# Patient Record
Sex: Male | Born: 1980 | Race: White | Hispanic: No | Marital: Married | Smoking: Never smoker
Health system: Southern US, Community
[De-identification: ages and names within clinical notes are randomized; demographics above are authoritative.]

## PROBLEM LIST (undated history)

## (undated) DIAGNOSIS — I341 Nonrheumatic mitral (valve) prolapse: Secondary | ICD-10-CM

## (undated) DIAGNOSIS — F32A Depression, unspecified: Secondary | ICD-10-CM

## (undated) DIAGNOSIS — F419 Anxiety disorder, unspecified: Secondary | ICD-10-CM

## (undated) DIAGNOSIS — T7840XA Allergy, unspecified, initial encounter: Secondary | ICD-10-CM

## (undated) HISTORY — DX: Anxiety disorder, unspecified: F41.9

## (undated) HISTORY — DX: Allergy, unspecified, initial encounter: T78.40XA

## (undated) HISTORY — DX: Depression, unspecified: F32.A

## (undated) HISTORY — PX: APPENDECTOMY: SHX54

---

## 2006-08-19 ENCOUNTER — Other Ambulatory Visit: Payer: Self-pay

## 2006-08-19 ENCOUNTER — Emergency Department: Payer: Self-pay | Admitting: Emergency Medicine

## 2006-08-24 ENCOUNTER — Ambulatory Visit: Payer: Self-pay | Admitting: Emergency Medicine

## 2007-01-10 ENCOUNTER — Other Ambulatory Visit: Payer: Self-pay

## 2007-01-10 ENCOUNTER — Emergency Department: Payer: Self-pay | Admitting: Emergency Medicine

## 2009-03-17 ENCOUNTER — Emergency Department: Payer: Self-pay | Admitting: Emergency Medicine

## 2010-02-21 ENCOUNTER — Observation Stay: Payer: Self-pay | Admitting: Surgery

## 2010-02-25 LAB — PATHOLOGY REPORT

## 2010-11-27 ENCOUNTER — Emergency Department: Payer: Self-pay | Admitting: Emergency Medicine

## 2010-12-09 ENCOUNTER — Emergency Department: Payer: Self-pay | Admitting: Emergency Medicine

## 2010-12-11 ENCOUNTER — Emergency Department: Payer: Self-pay | Admitting: *Deleted

## 2011-02-14 ENCOUNTER — Emergency Department: Payer: Self-pay | Admitting: *Deleted

## 2012-03-10 ENCOUNTER — Emergency Department: Payer: Self-pay | Admitting: Emergency Medicine

## 2012-03-10 LAB — COMPREHENSIVE METABOLIC PANEL
Albumin: 4.7 g/dL (ref 3.4–5.0)
Alkaline Phosphatase: 77 U/L (ref 50–136)
Anion Gap: 6 — ABNORMAL LOW (ref 7–16)
Bilirubin,Total: 0.6 mg/dL (ref 0.2–1.0)
Calcium, Total: 9.1 mg/dL (ref 8.5–10.1)
Chloride: 107 mmol/L (ref 98–107)
Co2: 25 mmol/L (ref 21–32)
EGFR (Non-African Amer.): >60
Osmolality: 275 (ref 275–301)
SGOT(AST): 40 U/L — ABNORMAL HIGH (ref 15–37)
SGPT (ALT): 34 U/L (ref 12–78)
Sodium: 138 mmol/L (ref 136–145)

## 2012-03-10 LAB — CBC
HCT: 47.4 % (ref 40.0–52.0)
MCH: 30.1 pg (ref 26.0–34.0)
MCHC: 34.4 g/dL (ref 32.0–36.0)
Platelet: 235 10*3/uL (ref 150–440)
RBC: 5.42 10*6/uL (ref 4.40–5.90)
RDW: 13.4 % (ref 11.5–14.5)

## 2012-03-10 LAB — LIPASE, BLOOD: Lipase: 106 U/L (ref 73–393)

## 2012-03-24 DIAGNOSIS — K219 Gastro-esophageal reflux disease without esophagitis: Secondary | ICD-10-CM | POA: Insufficient documentation

## 2012-03-24 DIAGNOSIS — E663 Overweight: Secondary | ICD-10-CM | POA: Insufficient documentation

## 2012-03-24 DIAGNOSIS — E669 Obesity, unspecified: Secondary | ICD-10-CM | POA: Insufficient documentation

## 2012-05-10 DIAGNOSIS — K5900 Constipation, unspecified: Secondary | ICD-10-CM | POA: Insufficient documentation

## 2012-07-29 ENCOUNTER — Ambulatory Visit: Payer: Self-pay | Admitting: Internal Medicine

## 2015-04-25 ENCOUNTER — Encounter: Payer: Self-pay | Admitting: Emergency Medicine

## 2015-04-25 ENCOUNTER — Emergency Department
Admission: EM | Admit: 2015-04-25 | Discharge: 2015-04-25 | Disposition: A | Discharge status: Home / Self Care | Payer: BLUE CROSS/BLUE SHIELD | Attending: Emergency Medicine | Admitting: Emergency Medicine

## 2015-04-25 ENCOUNTER — Emergency Department: Payer: BLUE CROSS/BLUE SHIELD

## 2015-04-25 DIAGNOSIS — F419 Anxiety disorder, unspecified: Secondary | ICD-10-CM | POA: Diagnosis not present

## 2015-04-25 DIAGNOSIS — F17200 Nicotine dependence, unspecified, uncomplicated: Secondary | ICD-10-CM | POA: Insufficient documentation

## 2015-04-25 DIAGNOSIS — R079 Chest pain, unspecified: Secondary | ICD-10-CM | POA: Insufficient documentation

## 2015-04-25 HISTORY — DX: Nonrheumatic mitral (valve) prolapse: I34.1

## 2015-04-25 LAB — URINALYSIS COMPLETE WITH MICROSCOPIC (ARMC ONLY)
BACTERIA UA: NONE SEEN
Bilirubin Urine: NEGATIVE
GLUCOSE, UA: NEGATIVE mg/dL
Hgb urine dipstick: NEGATIVE
Ketones, ur: NEGATIVE mg/dL
Leukocytes, UA: NEGATIVE
Nitrite: NEGATIVE
Protein, ur: NEGATIVE mg/dL
RBC / HPF: NONE SEEN RBC/hpf (ref 0–5)
Specific Gravity, Urine: 1.003 — ABNORMAL LOW (ref 1.005–1.030)
Squamous Epithelial / LPF: NONE SEEN
pH: 7 (ref 5.0–8.0)

## 2015-04-25 LAB — BASIC METABOLIC PANEL
Anion gap: 6 (ref 5–15)
BUN: 11 mg/dL (ref 6–20)
CO2: 26 mmol/L (ref 22–32)
Calcium: 9.5 mg/dL (ref 8.9–10.3)
Chloride: 104 mmol/L (ref 101–111)
Creatinine, Ser: 1.08 mg/dL (ref 0.61–1.24)
GFR calc Af Amer: >60 mL/min (ref >60)
GLUCOSE: 107 mg/dL — AB (ref 65–99)
POTASSIUM: 3.7 mmol/L (ref 3.5–5.1)
Sodium: 136 mmol/L (ref 135–145)

## 2015-04-25 LAB — CBC
HEMATOCRIT: 45.6 % (ref 40.0–52.0)
HEMOGLOBIN: 15.7 g/dL (ref 13.0–18.0)
MCH: 29.8 pg (ref 26.0–34.0)
MCHC: 34.5 g/dL (ref 32.0–36.0)
MCV: 86.2 fL (ref 80.0–100.0)
Platelets: 236 10*3/uL (ref 150–440)
RBC: 5.29 MIL/uL (ref 4.40–5.90)
RDW: 13.6 % (ref 11.5–14.5)
WBC: 14.7 10*3/uL — ABNORMAL HIGH (ref 3.8–10.6)

## 2015-04-25 LAB — GLUCOSE, CAPILLARY: Glucose-Capillary: 91 mg/dL (ref 65–99)

## 2015-04-25 LAB — TSH: TSH: 1.509 u[IU]/mL (ref 0.350–4.500)

## 2015-04-25 LAB — TROPONIN I: Troponin I: <0.03 ng/mL (ref <0.031)

## 2015-04-25 LAB — OSMOLALITY: Osmolality: 285 mOsm/kg (ref 275–295)

## 2015-04-25 MED ORDER — ALPRAZOLAM 0.25 MG PO TABS: 0.2500 mg | ORAL_TABLET | Freq: Two times a day (BID) | ORAL | Status: DC

## 2015-04-25 NOTE — ED Provider Notes (Signed)
[facility] Emergency Department Provider Note   ____________________________________________  Time seen:  I have reviewed the triage vital signs and the triage nursing note.  HISTORY  Chief Complaint Chest Pain   Historian Patient  HPI Ian Harper is a 35 y.o. male with a history of mitral valve prolapse, who is here for complaint of feeling of anxiety, occasional palpitations, and chest pain that he notices centrally usually when he wakes up at night. He is extremely anxious about his health. He started having increased thirst and urination several weeks ago, and became extremely worried that he might have diabetes. He is continued to be extremely anxious about this despite having a reassuring [facility]. He has started exercising excessively, and is actually having extreme aversion to food despite being hungry, he is worried that it will lead to diabetes.  Symptoms are moderate. He is currently not having chest pain. He does still feeling anxious.    Past Medical History  Diagnosis Date  . Mitral valve prolapse     There are no active problems to display for this patient.   Past Surgical History  Procedure Laterality Date  . Appendectomy      Current Outpatient Rx  Name  Route  Sig  Dispense  Refill  . ALPRAZolam (XANAX) 0.25 MG tablet   Oral   Take 1 tablet (0.25 mg total) by mouth 2 (two) times daily.   8 tablet   0     Allergies Review of patient's allergies indicates no known allergies.  No family history on file.  Social History Social History  Substance Use Topics  . Smoking status: Current Every Day Smoker  . Smokeless tobacco: None  . Alcohol Use: No    Review of Systems  Constitutional: Negative for fever. Eyes: Negative for visual changes. ENT: Negative for sore throat. Cardiovascular: As per history of present illness Respiratory: No cough. Gastrointestinal: Negative for  abdominal pain, vomiting and diarrhea. Genitourinary: Negative for dysuria. Musculoskeletal: Negative for back pain. Skin: Negative for rash. Neurological: Negative for headache. 10 point Review of Systems otherwise negative ____________________________________________   PHYSICAL EXAM:  VITAL SIGNS: ED Triage Vitals  Enc Vitals Group     BP 04/25/15 0826 129/80 mmHg     Pulse Rate 04/25/15 0826 80     Resp 04/25/15 0826 18     Temp 04/25/15 0826 98 F (36.7 C)     Temp Source 04/25/15 0826 Oral     SpO2 04/25/15 0826 99 %     Weight 04/25/15 0826 221 lb (100.245 kg)     Height 04/25/15 0826 5\' 9"  (1.753 m)     Head Cir --      Peak Flow --      Pain Score 04/25/15 0835 0     Pain Loc --      Pain Edu? --      Excl. in GC? --      Constitutional: Alert and oriented. Well appearing and in no distress. Pretty anxious. HEENT   Head: Normocephalic and atraumatic.      Eyes: Conjunctivae are normal. PERRL. Normal extraocular movements.      Ears:         Nose: No congestion/rhinnorhea.   Mouth/Throat: Mucous membranes are moist.   Neck: No stridor. Cardiovascular/Chest: Normal rate, regular rhythm.  No murmurs, rubs, or gallops. Respiratory: Normal respiratory effort without tachypnea nor retractions. Breath sounds are clear and equal bilaterally. No wheezes/rales/rhonchi. Gastrointestinal:  Soft. No distention, no guarding, no rebound. Nontender.    Genitourinary/rectal:Deferred Musculoskeletal: Nontender with normal range of motion in all extremities. No joint effusions.  No lower extremity tenderness.  No edema. Neurologic:  Normal speech and language. No gross or focal neurologic deficits are appreciated. Skin:  Skin is warm, dry and intact. No rash noted. Psychiatric: Anxious. No depression. Affect is normal. Speech and behavior are normal. Patient exhibits appropriate insight and judgment.  ____________________________________________   EKG I, Governor Rooks,  MD, the attending physician have personally viewed and interpreted all ECGs.  85 bpm. Normal sinus rhythm. Narrow QRS. Normal axis. Normal ST and T-wave ____________________________________________  LABS (pertinent positives/negatives)  Basic minimal panel within normal limits White blood count 14.7, hemoglobin 15.7, platelet count 236 Urinalysis negative TSH 1.509 Osmolality 285 Troponin less than 0.03 ____________________________________________  RADIOLOGY All Xrays were viewed by me. Imaging interpreted by Radiologist.  Chest two-view:  IMPRESSION: Old granulomatous disease and mild bronchitic changes without acute infiltrate. __________________________________________  PROCEDURES  Procedure(s) performed: None  Critical Care performed: None  ____________________________________________   ED COURSE / ASSESSMENT AND PLAN  Pertinent labs & imaging results that were available during my care of the patient were reviewed by me and considered in my medical decision making (see chart for details).   Although the initial chief complaint was chest pain, his symptoms didn't not sound concerning for emergency cause of chest pain or shortness of breath. His exam and evaluation are reassuring. I'm not suspicious for ACS, vascular emergency, or pulmonary emergency including PE.  Patient is very anxious. He is recently become fixated on concern about being diabetic or prediabetic. I discussed with him is reassuring recent hematoma A1c, and his evaluation today.  We discussed at length getting the anxiety under control so that he can move forward. He is no indication for need for emergency psychiatric evaluation. I'm asking him to follow-up with his primary care physician, and I will also refer him with information for [facility].     CONSULTATIONS:   None   Patient / Family / Caregiver informed of clinical course, medical decision-making process, and agree with plan.   I discussed  return precautions, follow-up instructions, and discharged instructions with patient and/or family.   ___________________________________________   FINAL CLINICAL IMPRESSION(S) / ED DIAGNOSES   Final diagnoses:  Anxiety  Nonspecific chest pain              Note: This dictation was prepared with Dragon dictation. Any transcriptional errors that result from this process are unintentional   Governor Rooks, MD 04/25/15 1348

## 2015-04-25 NOTE — ED Notes (Addendum)
Pt here with c/o chest pain x4 day, wakes him up at night. Pt reports centralized CP. Pt reports he's been thirsty lately, had his CBG checked last week and it was 112; reports he's been working out more lately, has been urinating. Also reports loss of appetite recently.

## 2015-04-25 NOTE — ED Notes (Signed)
Pt roomed, vitals taken, EKG performed and exported by tech, Elita QuickPam

## 2015-04-25 NOTE — Discharge Instructions (Signed)
You were evaluated for multiple symptoms, and your exam and evaluation are reassuring in the emergency department today. We discussed, that it seems like anxiety is creating a significant amount of stress and possibly even symptoms. Continue to work on Magazine features editor or flight reflex.  You may find it helpful to look into programs on "amygdala" retraining such as Dr. Chales Abrahams online, or "Dynamic Neural Retraining System" by Blanchie Dessert online, or "Tapping" technique - thetappingsolution.com or even try You Tube for examples.  Return to the emergency department if you have any new or worsening condition including chest pain, trouble breathing, fever, abdominal pain, black or bloody stool, or any other symptoms concerning to you.   Generalized Anxiety Disorder Generalized anxiety disorder (GAD) is a mental disorder. It interferes with life functions, including relationships, work, and school. GAD is different from normal anxiety, which everyone experiences at some point in their lives in response to specific life events and activities. Normal anxiety actually helps [place] prepare for and get through these life events and activities. Normal anxiety goes away after the event or activity is over.  GAD causes anxiety that is not necessarily related to specific events or activities. It also causes excess anxiety in proportion to specific events or activities. The anxiety associated with GAD is also difficult to control. GAD can vary from mild to severe. People with severe GAD can have intense waves of anxiety with physical symptoms (panic attacks).  SYMPTOMS The anxiety and worry associated with GAD are difficult to control. This anxiety and worry are related to many life events and activities and also occur more days than not for 6 months or longer. People with GAD also have three or more of the following symptoms (one or more in children):  Restlessness.   Fatigue.  Difficulty concentrating.    Irritability.  Muscle tension.  Difficulty sleeping or unsatisfying sleep. DIAGNOSIS GAD is diagnosed through an assessment by your health care provider. Your health care provider will ask you questions aboutyour mood,physical symptoms, and events in your life. Your health care provider may ask you about your medical history and use of alcohol or drugs, including prescription medicines. Your health care provider may also do a physical exam and blood tests. Certain medical conditions and the use of certain substances can cause symptoms similar to those associated with GAD. Your health care provider may refer you to a mental health specialist for further evaluation. TREATMENT The following therapies are usually used to treat GAD:   Medication. Antidepressant medication usually is prescribed for long-term daily control. Antianxiety medicines may be added in severe cases, especially when panic attacks occur.   Talk therapy (psychotherapy). Certain types of talk therapy can be helpful in treating GAD by providing support, education, and guidance. A form of talk therapy called cognitive behavioral therapy can teach you healthy ways to think about and react to daily life events and activities.  Stress managementtechniques. These include yoga, meditation, and exercise and can be very helpful when they are practiced regularly. A mental health specialist can help determine which treatment is best for you. Some people see improvement with one therapy. However, other people require a combination of therapies.   This information is not intended to replace advice given to you by your health care provider. Make sure you discuss any questions you have with your health care provider.   Document Released: 05/30/2012 Document Revised: 02/23/2014 Document Reviewed: 05/30/2012 Elsevier Interactive Patient Education 2016 Elsevier Inc.   Panic Attacks Panic  attacks are sudden, short-livedsurges of severe  anxiety, fear, or discomfort. They may occur for no reason when you are relaxed, when you are anxious, or when you are sleeping. Panic attacks may occur for a number of reasons:   Healthy people occasionally have panic attacks in extreme, life-threatening situations, such as war or natural disasters. Normal anxiety is a protective mechanism of the body that helps us react to danger (fight or flight response).  Panic attacks are often seen with anxiety disorders, such as panic disorder, social anxiety disorder, generalized anxiety disorder, and phobias. Anxiety disorders cause excessive or uncontrollable anxiety. They may interfere with your relationships or other life activities.  Panic attacks are sometimes seen with other mental illnesses, such as depression and posttraumatic stress disorder.  Certain medical conditions, prescription medicines, and drugs of abuse can cause panic attacks. SYMPTOMS  Panic attacks start suddenly, peak within 20 minutes, and are accompanied by four or more of the following symptoms:  Pounding heart or fast heart rate (palpitations).  Sweating.  Trembling or shaking.  Shortness of breath or feeling smothered.  Feeling choked.  Chest pain or discomfort.  Nausea or strange feeling in your stomach.  Dizziness, light-headedness, or feeling like you will faint.  Chills or hot flushes.  Numbness or tingling in your lips or hands and feet.  Feeling that things are not real or feeling that you are not yourself.  Fear of losing control or going crazy.  Fear of dying. Some of these symptoms can mimic serious medical conditions. For example, you may think you are having a heart attack. Although panic attacks can be very scary, they are not life threatening. DIAGNOSIS  Panic attacks are diagnosed through an assessment by your health care provider. Your health care provider will ask questions about your symptoms, such as where and when they occurred. Your  health care provider will also ask about your medical history and use of alcohol and drugs, including prescription medicines. Your health care provider may order blood tests or other studies to rule out a serious medical condition. Your health care provider may refer you to a mental health professional for further evaluation. TREATMENT   Most healthy people who have one or two panic attacks in an extreme, life-threatening situation will not require treatment.  The treatment for panic attacks associated with anxiety disorders or other mental illness typically involves counseling with a mental health professional, medicine, or a combination of both. Your health care provider will help determine what treatment is best for you.  Panic attacks due to physical illness usually go away with treatment of the illness. If prescription medicine is causing panic attacks, talk with your health care provider about stopping the medicine, decreasing the dose, or substituting another medicine.  Panic attacks due to alcohol or drug abuse go away with abstinence. Some adults need professional help in order to stop drinking or using drugs. HOME CARE INSTRUCTIONS   Take all medicines as directed by your health care provider.   Schedule and attend follow-up visits as directed by your health care provider. It is important to keep all your appointments. SEEK MEDICAL CARE IF:  You are not able to take your medicines as prescribed.  Your symptoms do not improve or get worse. SEEK IMMEDIATE MEDICAL CARE IF:   You experience panic attack symptoms that are different than your usual symptoms.  You have serious thoughts about hurting yourself or others.  You are taking medicine for panic attacks and have a  serious side effect. MAKE SURE YOU:  Understand these instructions.  Will watch your condition.  Will get help right away if you are not doing well or get worse.   This information is not intended to replace  advice given to you by your health care provider. Make sure you discuss any questions you have with your health care provider.   Document Released: 02/02/2005 Document Revised: 02/07/2013 Document Reviewed: 09/16/2012 Elsevier Interactive Patient Education [employer]

## 2015-05-15 ENCOUNTER — Emergency Department
Admission: EM | Admit: 2015-05-15 | Discharge: 2015-05-15 | Disposition: A | Discharge status: Home / Self Care | Payer: BLUE CROSS/BLUE SHIELD | Attending: Emergency Medicine | Admitting: Emergency Medicine

## 2015-05-15 DIAGNOSIS — F419 Anxiety disorder, unspecified: Secondary | ICD-10-CM

## 2015-05-15 DIAGNOSIS — R350 Frequency of micturition: Secondary | ICD-10-CM | POA: Diagnosis present

## 2015-05-15 DIAGNOSIS — I341 Nonrheumatic mitral (valve) prolapse: Secondary | ICD-10-CM | POA: Insufficient documentation

## 2015-05-15 DIAGNOSIS — Z79899 Other long term (current) drug therapy: Secondary | ICD-10-CM | POA: Insufficient documentation

## 2015-05-15 DIAGNOSIS — F17200 Nicotine dependence, unspecified, uncomplicated: Secondary | ICD-10-CM | POA: Insufficient documentation

## 2015-05-15 NOTE — Discharge Instructions (Signed)
Please seek medical attention for any high fevers, chest pain, shortness of breath, change in behavior, persistent vomiting, bloody stool or any other new or concerning symptoms.  

## 2015-05-15 NOTE — ED Notes (Signed)
Pt states urinary frequency and painful urination x 2-3 weeks. Pt states pain in R groin and lower R back. Pt denies hx of UTI or kidney stones. Pt states hx of odorous urine. Pt states painful ejaculation during sexual intercourse recently. Pt states urination q1h during morning, urine darker in evening. Pt states measuring urine cloudy and possible sediment in urine.

## 2015-05-15 NOTE — ED Provider Notes (Signed)
[facility] Emergency Department Provider Note    ____________________________________________  Time seen: ~1930  I have reviewed the triage vital signs and the nursing notes.   HISTORY  Chief Complaint Urinary Frequency   History limited by: Not Limited   HPI Ian Harper is a 35 y.o. male who presents to the emergency department today because of continued concerns for urinary problems. The patient states that he has been having some pain and discoloration of urine. This is been going on for roughly 1 month. He was seen in the emergency department earlier this month and had normal urine and blood work. He then saw a walk-in clinic 4 days ago and was again told that he had a normal urine and blood work. The symptoms have not changed since he was seen in the urgent care center 4 days ago. He denies any fevers.    Past Medical History  Diagnosis Date  . Mitral valve prolapse     There are no active problems to display for this patient.   Past Surgical History  Procedure Laterality Date  . Appendectomy      Current Outpatient Rx  Name  Route  Sig  Dispense  Refill  . ALPRAZolam (XANAX) 0.25 MG tablet   Oral   Take 1 tablet (0.25 mg total) by mouth 2 (two) times daily.   8 tablet   0     Allergies Review of patient's allergies indicates no known allergies.  History reviewed. No pertinent family history.  Social History Social History  Substance Use Topics  . Smoking status: Current Every Day Smoker  . Smokeless tobacco: None  . Alcohol Use: No    Review of Systems  Constitutional: Negative for fever. Cardiovascular: Negative for chest pain. Respiratory: Negative for shortness of breath. Gastrointestinal: Negative for abdominal pain, vomiting and diarrhea. Neurological: Negative for headaches, focal weakness or numbness.  10-point ROS otherwise negative.  ____________________________________________   PHYSICAL EXAM:  VITAL  SIGNS: ED Triage Vitals  Enc Vitals Group     BP 05/15/15 1921 130/85 mmHg     Pulse Rate 05/15/15 1921 78     Resp 05/15/15 1921 18     Temp 05/15/15 1921 98.6 F (37 C)     Temp Source 05/15/15 1921 Oral     SpO2 05/15/15 1921 99 %     Weight 05/15/15 1921 215 lb (97.523 kg)     Height 05/15/15 1921  (1.753 m)     Head Cir --      Peak Flow --      Pain Score 05/15/15 1925 0   Constitutional: Alert and oriented. Appears anxious. Eyes: Conjunctivae are normal. PERRL. Normal extraocular movements. ENT   Head: Normocephalic and atraumatic.   Nose: No congestion/rhinnorhea.   Mouth/Throat: Mucous membranes are moist.   Neck: No stridor. Hematological/Lymphatic/Immunilogical: No cervical lymphadenopathy. Cardiovascular: Normal rate, regular rhythm.  No murmurs, rubs, or gallops. Respiratory: Normal respiratory effort without tachypnea nor retractions. Breath sounds are clear and equal bilaterally. No wheezes/rales/rhonchi. Gastrointestinal: Soft and nontender. No distention.  Genitourinary: Deferred Musculoskeletal: Normal range of motion in all extremities. No joint effusions.   Neurologic:  Normal speech and language. No gross focal neurologic deficits are appreciated.  Skin:  Skin is warm, dry and intact. No rash noted. Psychiatric: Mood and affect are normal. Speech and behavior are normal. Patient exhibits appropriate insight and judgment.  ____________________________________________    LABS (pertinent positives/negatives)  None  ____________________________________________   EKG  None  ____________________________________________    RADIOLOGY  None  ____________________________________________   PROCEDURES  Procedure(s) performed: None  Critical Care performed: No  ____________________________________________   INITIAL IMPRESSION / ASSESSMENT AND PLAN / ED COURSE  Pertinent labs & imaging results that were available during my  care of the patient were reviewed by me and considered in my medical decision making (see chart for details).  Patient presented to the emergency department today with continued complaints of urinary symptoms. Patient has been evaluated for this very recently. 4 days ago he had a negative urine and blood work. Symptoms have not changed since that time. Patient is quite anxious. I did have a discussion with patient and he mentioned his concern that he might have cancer. He asked if the blood work showed cancer. I discussed with the patient that the blood work that was sent both the initial time using the emergency department and when he saw primary care did not specifically test for cancer. I asked why he was concerned for cancer and it seemed that he was simply anxious about it. I tried to discuss with the patient the unlikely scenario that he might have prostate cancer. I told him that given his age and symptoms I highly doubt prostate cancer would be the etiology. At this point I do not feel that repeating blood work or urine would be very productive. Given the symptoms have not changed I would not expect a change in the results. I did encourage patient to keep appointment with his primary care that he is now scheduled.  ____________________________________________   FINAL CLINICAL IMPRESSION(S) / ED DIAGNOSES  Final diagnoses:  Anxiety     Phineas SemenGraydon Arie Gable, MD 05/15/15 1950

## 2015-06-13 DIAGNOSIS — Z7189 Other specified counseling: Secondary | ICD-10-CM | POA: Insufficient documentation

## 2015-06-13 DIAGNOSIS — Z7185 Encounter for immunization safety counseling: Secondary | ICD-10-CM | POA: Insufficient documentation

## 2015-07-04 ENCOUNTER — Other Ambulatory Visit: Payer: Self-pay | Admitting: Gastroenterology

## 2015-07-04 DIAGNOSIS — K6289 Other specified diseases of anus and rectum: Secondary | ICD-10-CM

## 2015-07-04 DIAGNOSIS — Z87898 Personal history of other specified conditions: Secondary | ICD-10-CM

## 2015-07-04 DIAGNOSIS — N419 Inflammatory disease of prostate, unspecified: Secondary | ICD-10-CM

## 2015-07-04 DIAGNOSIS — R1031 Right lower quadrant pain: Secondary | ICD-10-CM

## 2015-07-04 DIAGNOSIS — R591 Generalized enlarged lymph nodes: Secondary | ICD-10-CM

## 2015-07-04 DIAGNOSIS — R634 Abnormal weight loss: Secondary | ICD-10-CM

## 2015-07-05 ENCOUNTER — Encounter: Payer: Self-pay | Admitting: Internal Medicine

## 2015-07-10 ENCOUNTER — Ambulatory Visit: Admission: RE | Admit: 2015-07-10 | Payer: BLUE CROSS/BLUE SHIELD | Source: Ambulatory Visit

## 2015-07-17 ENCOUNTER — Ambulatory Visit: Payer: BLUE CROSS/BLUE SHIELD

## 2015-07-19 ENCOUNTER — Encounter: Payer: Self-pay | Admitting: Internal Medicine

## 2015-07-24 ENCOUNTER — Ambulatory Visit: Admission: RE | Admit: 2015-07-24 | Payer: BLUE CROSS/BLUE SHIELD | Source: Ambulatory Visit

## 2015-07-26 ENCOUNTER — Telehealth: Payer: Self-pay | Admitting: *Deleted

## 2015-07-26 DIAGNOSIS — R9389 Abnormal findings on diagnostic imaging of other specified body structures: Secondary | ICD-10-CM

## 2015-07-26 NOTE — Telephone Encounter (Signed)
-----   Message from Shane CrutchPradeep Ramachandran, MD sent at 07/25/2015  8:49 AM EDT ----- Regarding: needs cxr Pt has appt on 6/12 for chest pain. I don't see a CXR, please have 2-view CXR  performed before the appointment or make sure the pt has one on CD performed within the past month.

## 2015-07-26 NOTE — Telephone Encounter (Signed)
LMOM for pt that [facility] and to give me a call back.

## 2015-07-29 ENCOUNTER — Encounter: Payer: BLUE CROSS/BLUE SHIELD | Admitting: Internal Medicine

## 2015-07-29 NOTE — Telephone Encounter (Signed)
LMOM for pt to make sure he received my voicemail and to call back with any questions.

## 2015-08-05 ENCOUNTER — Other Ambulatory Visit
Admission: RE | Admit: 2015-08-05 | Discharge: 2015-08-05 | Disposition: A | Discharge status: Home / Self Care | Payer: BLUE CROSS/BLUE SHIELD | Source: Ambulatory Visit | Attending: Internal Medicine | Admitting: Internal Medicine

## 2015-08-05 ENCOUNTER — Ambulatory Visit
Admission: RE | Admit: 2015-08-05 | Discharge: 2015-08-05 | Disposition: A | Discharge status: Home / Self Care | Payer: BLUE CROSS/BLUE SHIELD | Source: Ambulatory Visit | Attending: Internal Medicine | Admitting: Internal Medicine

## 2015-08-05 ENCOUNTER — Encounter: Payer: Self-pay | Admitting: Internal Medicine

## 2015-08-05 ENCOUNTER — Ambulatory Visit (INDEPENDENT_AMBULATORY_CARE_PROVIDER_SITE_OTHER): Payer: BLUE CROSS/BLUE SHIELD | Admitting: Internal Medicine

## 2015-08-05 VITALS — BP 124/70 | HR 82 | Ht 69.0 in | Wt 207.0 lb

## 2015-08-05 DIAGNOSIS — R911 Solitary pulmonary nodule: Secondary | ICD-10-CM | POA: Diagnosis not present

## 2015-08-05 DIAGNOSIS — B394 Histoplasmosis capsulati, unspecified: Secondary | ICD-10-CM

## 2015-08-05 DIAGNOSIS — R938 Abnormal findings on diagnostic imaging of other specified body structures: Secondary | ICD-10-CM | POA: Insufficient documentation

## 2015-08-05 DIAGNOSIS — R591 Generalized enlarged lymph nodes: Secondary | ICD-10-CM | POA: Diagnosis not present

## 2015-08-05 DIAGNOSIS — R9389 Abnormal findings on diagnostic imaging of other specified body structures: Secondary | ICD-10-CM

## 2015-08-05 NOTE — Patient Instructions (Addendum)
--  Will check urine histoplasma antigen.  --Will check serum ACE level. --Keep pets out of bedroom.   --If you continue to have chest colds start on an antihistamine such as claritin, if continues, then call us back to be evaluated.   --CXR 2 view in 1 year and follow up at that time.

## 2015-08-05 NOTE — Progress Notes (Signed)
[name]      Assessment and Plan:  Hilar lymphadenopathy. -This is likely related to granulomatous disease, which may relate to previous infection or other conditions such as sarcoidosis. The patient used to live in the [place], and thus is at high risk for exposure to histoplasmosis. -We will check an Ace level, urine histo antigen.  -The patient's lymphadenopathy appears to have calcified, pointing to a likely benign cause. We'll check The above testing to look for active infection or sarcoidosis.  Lung nodule. -Review of previous chest x-rays and CT films from 2002 and 2003 show evidence of nodule in the right middle lobe that time, this appears to have progressed since that time. -Given 15 year history of these nodules, this likely represents a benign process, we'll continue to follow with yearly chest x-rays.  Allergic rhino-sinusitis, possible asthma. -He notes that he has occasional episodes of some chest tightness, he currently lives with a cat that sleeps in the bed. -Recommend that he remove the pets from the bedroom, also recommended that he could start taking Claritin if he starts to have these episodes more often. I also asked that he call us should he start to develop respiratory problems for further evaluation and consideration of starting therapy for asthma. However, at this time, these episodes are very infrequent and mild. Therefore, would not initiate therapy for asthma at this time unless the patient started develop further symptoms.   Date: 08/05/2015  MRN# [id number] Ian Harper [birth year]  Referring Physician:   Alven Harper is a 35 y.o. old male seen in consultation for chief complaint of:    Chief Complaint  Patient presents with  . pulmonary consult    pt ref by city of [place]. had CXR today. pt c/o occ hot flashes. denies sob, cough, wheezing or cp/tightness     HPI:   Back in 2002 he was diagnosed with MVP, he had  a CXR which showed lung nodule and was told to keep an eye on it, but he did not for the past 15 years.  He notes that last winter he had a few episodes of laryngitis but not trouble breathing. Earlier this year he had issues with some sneezing and wheezing which felt like allergies, this has not repeat since that time.   He is a nonsmoker.  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 He used to live [place]. He has never been a smoker. He is active and has no breathing issues, he notes that he occasionally has itchiness in his lung.  He has a cat in the bedroom and sleeps in the bed.  He has no stuffy nose. He takes no antihistamine.   Review of chest x-ray film from 04/25/15: No other images are available for viewing. There is left calcified hilar lymphadenopathy, also seen on the right side, as well as a possible nodule in the right mid zone. -PPD test 07/05/15 results reviewed, there is 0 mm of induration. -Lab testing including CBC, TSH drawn on 07/05/15, normal -Review of previous CT films were visualized: Scans from 06/11/2000, at that time, there appeared to be a 1.5-2 cm right middle lobe nodule did not notice any calcified lymph nodes in procedures at that time, there are no calcified lymph nodes within the liver, no calcified lymphadenopathy or nodules seen in addition, I see minimal mediastinal or hilar lymphadenopathy. Chest x-ray 2 view on 04/15/00. Hilar fullness, right middle lobe  nodule seen Chest x-ray 2 view 06/13/01, mild progression from changes seen previously, early calcification seen in the left hilar area.  PMHX:   Past Medical History  Diagnosis Date  . Mitral valve prolapse    Surgical Hx:  Past Surgical History  Procedure Laterality Date  . Appendectomy     Family Hx:  No family history on file. Social Hx:   Social History  Substance Use Topics  . Smoking status: Never Smoker   . Smokeless tobacco: None  . Alcohol  Use: No   Medication:   Current Outpatient Rx  Name  Route  Sig  Dispense  Refill  . ALPRAZolam (XANAX) 0.25 MG tablet   Oral   Take 1 tablet (0.25 mg total) by mouth 2 (two) times daily.   8 tablet   0       Allergies:  Review of patient's allergies indicates no known allergies.  Review of Systems: Gen:  Denies  fever, sweats, chills HEENT: Denies blurred vision, double vision. bleeds, sore throat Cvc:  No dizziness, chest pain. Resp:   Denies cough or sputum production, shortness of breath Gi: Denies swallowing difficulty, stomach pain. Gu:  Denies bladder incontinence, burning urine Ext:   No Joint pain, stiffness. Skin: No skin rash,  hives  Endoc:  No polyuria, polydipsia. Psych: No depression, insomnia. Other:  All other systems were reviewed with the patient and were negative other that what is mentioned in the HPI.   Physical Examination:   VS: BP 124/70 mmHg  Pulse 82  Ht 5\' 9"  (1.753 m)  Wt 207 lb (93.895 kg)  BMI 30.55 kg/m2  SpO2 99%  General Appearance: No distress  Neuro:without focal findings,  speech normal,  HEENT: PERRLA, EOM intact.   Pulmonary: normal breath sounds, No wheezing.  CardiovascularNormal S1,S2.  No m/r/g.   Abdomen: Benign, Soft, non-tender. Renal:  No costovertebral tenderness  GU:  No performed at this time. Endoc: No evident thyromegaly, no signs of acromegaly. Skin:   warm, no rashes, no ecchymosis  Extremities: normal, no cyanosis, clubbing.  Other findings:    LABORATORY PANEL:   CBC No results for input(s): WBC, HGB, HCT, PLT in the last 168 hours. ------------------------------------------------------------------------------------------------------------------  Chemistries  No results for input(s): NA, K, CL, CO2, GLUCOSE, BUN, CREATININE, CALCIUM, MG, AST, ALT, ALKPHOS, BILITOT in the last 168 hours.  Invalid input(s):  GFRCGP ------------------------------------------------------------------------------------------------------------------  Cardiac Enzymes No results for input(s): TROPONINI in the last 168 hours. ------------------------------------------------------------  RADIOLOGY:  No results found.     Thank  you for the consultation and for allowing [facility] to assist in the care of your patient. Our recommendations are noted above.  Please contact us if we can be of further service.   Wells Guileseep Amna Welker, MD.  Board Certified in Internal Medicine, Pulmonary Medicine, Critical Care Medicine, and Sleep Medicine.  [facility] Office Number: [phone]  Santiago Gladavid Kasa, M.D.  Stephanie AcreVishal Mungal, M.D.  Billy Fischeravid Simonds, M.D  08/05/2015

## 2015-08-06 LAB — HISTOPLASMA ANTIGEN, URINE: Histoplasma Antigen, urine: 0 ng/mL (ref 0.00–0.49)

## 2015-08-06 LAB — ANGIOTENSIN CONVERTING ENZYME: Angiotensin-Converting Enzyme: 44 U/L (ref 14–82)

## 2015-08-12 NOTE — Progress Notes (Signed)
This encounter was created in error - please disregard.

## 2016-04-03 ENCOUNTER — Ambulatory Visit: Payer: BLUE CROSS/BLUE SHIELD | Attending: Gastroenterology | Admitting: Physical Therapy

## 2016-04-03 DIAGNOSIS — R278 Other lack of coordination: Secondary | ICD-10-CM | POA: Diagnosis not present

## 2016-04-03 DIAGNOSIS — M6281 Muscle weakness (generalized): Secondary | ICD-10-CM

## 2016-04-03 NOTE — Patient Instructions (Signed)
You are now ready to begin training the deep core muscles system: diaphragm, transverse abdominis, pelvic floor . These muscles must work together as a Administrator, Civil Serviceteam.     INHALE TO LENGTHEN (smell soup)  EXHALE for a gentle lift of the pelvic floor  And belly hugs    The key to these exercises to train the brain to coordinate the timing of these muscles and to have them turn on for long periods of time to hold you upright against gravity (especially important if you are on your feet all day).These muscles are postural muscles and play a role stabilizing your spine and bodyweight. By doing these repetitions slowly and correctly instead of doing crunches, you will achieve a flatter belly without a lower pooch. You are also placing your spine in a more neutral position and breathing properly which in turn, decreases your risk for problems related to your pelvic floor, abdominal, and low back such as pelvic organ prolapse, hernias, diastasis recti (separation of superficial muscles), disk herniations, spinal fractures. These exercises set a solid foundation for you to later progress to resistance/ strength training with therabands and weights and return to other typical fitness exercises with a stronger deeper core.   Do level 1 : 10 reps  X 2 day and also when you feel nervous and need to dial into "relaxation station"   ___________  Write down the sensations and symptoms you have in your phone or notebook (date, time, activity)  Keep this to present to medical providers and refrain from researching on the internet. Find support from your medical team rather than escalate your worries from what you read on the internet.  ___________

## 2016-04-03 NOTE — Therapy (Addendum)
[place] [facility] [address] [place], [place], [address] Phone: [phone]   Fax:  [phone]  Physical Therapy Evaluation  Patient Details  Name: Ian KempJohn Harper MRN: [id number] Date of Birth: Jun 09, 1980 No Data Recorded  Encounter Date: 04/03/2016      PT End of Session -  04/04/16  1525    Visit Number 1   Number of Visits 12   Date for PT Re-Evaluation 06/26/16   PT Start Time 1305   PT Stop Time 1445   PT Time Calculation (min) 100 min   Activity Tolerance Patient tolerated treatment well;No increased pain   Behavior During Therapy WFL for tasks assessed/performed      Past Medical History:  Diagnosis Date  . Anxiety   . Mitral valve prolapse     Past Surgical History:  Procedure Laterality Date  . APPENDECTOMY      There were no vitals filed for this visit.       Subjective Assessment - 04/21/16 1600    Subjective 1) Pt has difficulty with eliminating bowels.  Pt has to strain and is not as easy as it used to be.  Pt noticed sudden onset around the anus in July 2017. Pt mistaken a wart ointment as Preparation H to address the itchiness which he thought was related yeast or hemmorrhoids. Pt applied it for 3 times and discontinued after recognizing the wrong ointment. By that time, the itchiness resolved a month later. At this time, the difficulty with eliminate started. Freqeuncy of elimination: once to every two days. with stool consistency of Type 3 .  Prior to this time, pt was normally having one daily bowel movement with Type 4 and without strain and less time. Pt had frequent urination with rectal pain in May which has since resolved. Pt consulted a urologist and he took antibiotics partially to decrease the chance prostatitis.  Pt thinks this issue may have been related to the stress he was expressing at the time. The frequent urination resolved once he managed his stress and not thinking about his Sx.  Pt 's  stress was triggered after worrying about being thirsty at the time around in March 2017 and a medical provider told him that he could have diabetes but no tests had been done yet. Pt became very worried, especially at night when he would wake up in the middle of the night w/ adreline rushes, anxiety, fast beating heart rate, and sweats. These epsiodes of anxiety progressed for 1-2 month and gradually dissipated from 2 x to 1x /night. Currently pt no longer has these anxiety epsiodes. Pt did find out that he was pre-diabetic which led him to worry about the foods he ate and gradually he restricted his diet to the extent where he could not eat and exercised vigorously. A trip to the ED occurred during this period. The problem with urination, rectal pain, tailbone occurred during this time and lasted for 3 months. These Sx have improved with less stress.  Pt finds that staying busy and fix projects help to distract him from focusing on anxiety. sudden onset beginning of August when he noticed itchiness. Marland Kitchen.    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 No injury onset. Pain occurs with long periods of sitting and laying in bed.  5-6/10. Tightness with no radiating pain.    3) weak ejaculation         Pertinent History Pt has been worried about getting medical conditions  and researches on the internet which causing more anxiety and fogginess in the brain.  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              [facility] PT Assessment - 04/04/16   1519      Assessment   Medical Diagnosis Chronic Constipation, IBS   Referring Provider --  Vevelyn Pat, NP     Precautions   Precautions None     Restrictions   Weight Bearing Restrictions No     Balance Screen   Has the patient fallen in the past 6 months No     Prior Function   Level of Independence Independent     Observation/Other Assessments   Observations slumped sitting, increased lumbar lordosis in stance, thoracic kyphosis     Other Surveys  --  ZUNG anxiety scale 56%, PDI 17%      Coordination   Gross Motor Movements are Fluid and Coordinated --  diaphragmatic breathing noted   Fine Motor Movements are Fluid and Coordinated --  abdominal straining with cue for bowel movement     Posture/Postural Control   Posture Comments lumbopelvic perturbation with leg movements in supine     AROM   Overall AROM Comments flexible spine      Palpation   Palpation comment no pelvic floor tensions noted with external palpation                 Pelvic Floor Special Questions -  04/04/16 1522    Diastasis Recti 3 fingers above umbilicus           [facility] Adult PT Treatment/Exercise -  04/04/16 1519      Therapeutic Activites    Therapeutic Activities --  see pt instructions     Neuro Re-ed    Neuro Re-ed Details  see pt instructions                PT Education -  04/04/16 1524    Education provided Yes   Education Details POC, anatomy/physiology, goals, HEP, motivational interviewing, biopsychosocial approaches,    Person(s) Educated Patient   Methods Explanation;Demonstration;Tactile cues;Verbal cues;Handout   Comprehension Returned demonstration;Verbalized understanding             PT Long Term Goals - 04/04/16 1539      PT LONG TERM GOAL #1   Title Pt will decrease his score on PDI 17% to < 12% in order to improve his QOL   Time 12   Period Weeks   Status New     PT LONG TERM GOAL #2   Title Pt will decrease his Zung Anxiety Scale from 56% to < 46% in order to demo improved relaxation skills to minimize relapse of Sx   Time 12   Period Weeks   Status New     PT LONG TERM GOAL #3   Title Pt will demo decreased abdominal separation from 3 fingers to < 1 fingers width in order to increase intraabdominal pressure to improve motility and bowel elimination    Time 12   Period Weeks     PT LONG TERM GOAL #4   Title Pt will demo no lumbopelvic perturbation with deep core  level 2-3 5 reps in order to increase postural stbaility to play sports at summer camp job with less LBP   Time 12   Period Weeks   Status New  Plan -  04/04/16  1526    Clinical Impression Statement Pt is a 36 yo male who complains of difficulty with eliminating bowels, weak ejaculation, and CLBP.  These deficits impact his QOL. Pt states anxiety influences his bowel issues and based on his subjective, pt tends to catastrophizes based on internet searches. Pt's clinical presentations include signs of an inefficent intraabdominal pressure system due to diastasis recti, poor deep core coordination, poor body mechanics, poor deep core strength and posture. Following Tx today, pt demo'd proper deep core coordination to decrease strain on abdominal/pelvic floor and understanding of proper toileting technique. Pt received thorough explanation of anatomy/ physiology, role of nervous system on pelvic function, and was advised to defer his questions to his health care providers and to refrain from internet searches.  Pt was provided biopsychosocial approaches and pain science education to decrease anxiety surrounding his Sx.     Rehab Potential Good   Clinical Impairments Affecting Rehab Potential anxiety   PT Frequency 1x / week   PT Duration 12 weeks   PT Treatment/Interventions ADLs/Self Care Home Management;Aquatic Therapy;Moist Heat;Stair training;Functional mobility training;Gait training;Therapeutic activities;Therapeutic exercise;Patient/family education;Neuromuscular re-education;Traction;Taping;Energy conservation  biopsychosocial approaches, pain science, relaxation techniques      Patient will benefit from skilled therapeutic intervention in order to improve the following deficits and impairments:  Improper body mechanics, Decreased strength, Hypermobility, Pain, Postural dysfunction, Decreased safety awareness, Decreased coordination, Decreased activity tolerance, Decreased  range of motion, Decreased balance, Decreased endurance, Decreased mobility  Visit Diagnosis: Other lack of coordination - Plan: PT plan of care cert/re-cert  Muscle weakness (generalized) - Plan: PT plan of care cert/re-cert     Problem List There are no active problems to display for this patient.   Mariane Masters ,PT, DPT, E-RYT  04/21/2016, 4:00 PM  [place] [facility] MAIN [facility] SERVICES [address] [place], [place], [address] Phone: [phone]   Fax:  [phone]  Name: Ian Harper MRN: [id number] Date of Birth: 1980-03-14

## 2016-04-10 ENCOUNTER — Encounter: Payer: BLUE CROSS/BLUE SHIELD | Admitting: Physical Therapy

## 2016-04-16 ENCOUNTER — Ambulatory Visit: Payer: BLUE CROSS/BLUE SHIELD | Attending: Gastroenterology | Admitting: Physical Therapy

## 2016-04-16 DIAGNOSIS — R278 Other lack of coordination: Secondary | ICD-10-CM | POA: Insufficient documentation

## 2016-04-16 DIAGNOSIS — M6281 Muscle weakness (generalized): Secondary | ICD-10-CM

## 2016-04-16 NOTE — Patient Instructions (Signed)
Ergonomic for desk (handout)   Proper sitting, feet under knees, on sitting bones    Colon massage (clockwise from lower R to upper R to upper L to lower L ) gentle for motility  5 reps before and after meal    ___________   Strengthening: 1) Prone Heel Press for strengthening sacro-iliac joints  1. Lie on your belly. If you have an arch in your low back or it feels umcomfortable, place a pillow under your low belly/hips to make sure your low back feel comfortable.   2. Place our forehead on top of your palms.      Widen your knees apart for starting position.   3. Inhale, feel belly and low back expand  4. Exhale, feel belly hug in, press heel together and count aloud for 5 sec. Then relax the heel squeezing.  Perform 10 reps of 5 sec holds. 2 sets/ day.    If you feel entire buttock tighten too much or feel low back pain, apply 50% less effort. As you press your heel together, you will feel as if your pubic bone (front of your pelvis) and sacrum (back of your pelvis) gentle move towards each other or your low abdominal muscles hug in more.        2) deep core level 2 ( knee out) HANDOUT   _______________  Stretches for pelvic floor :   1) Frog stretch: laying on belly with pillow under hips, knees bent, inhale do nothing, exhale let ankles fall apart ~4 10 reps x 3     2) childs pose rocking 5-10 reps     ______________

## 2016-04-17 ENCOUNTER — Encounter: Payer: BLUE CROSS/BLUE SHIELD | Admitting: Physical Therapy

## 2016-04-17 NOTE — Therapy (Signed)
[place] [facility] [address] [place], [place], [address] Phone: [phone]   Fax:  [phone]  Physical Therapy Treatment  Patient Details  Name: Ian KempJohn Harper MRN: [id number] Date of Birth: 13-Jan-1981 No Data Recorded  Encounter Date: 04/16/2016      PT End of Session - 04/17/16 2131    Visit Number 2   Number of Visits 12   Date for PT Re-Evaluation 06/26/16   PT Start Time 1630   PT Stop Time 1720   PT Time Calculation (min) 50 min      Past Medical History:  Diagnosis Date  . Mitral valve prolapse     Past Surgical History:  Procedure Laterality Date  . APPENDECTOMY      There were no vitals filed for this visit.                                         Patient will benefit from skilled therapeutic intervention in order to improve the following deficits and impairments:     Visit Diagnosis: Other lack of coordination  Muscle weakness (generalized)     Problem List There are no active problems to display for this patient.   Mariane MastersYeung,Shin Yiing ,PT, DPT, E-RYT  04/17/2016, 9:32 PM  [place] [facility] [address] [place], [place], [address] Phone: [phone]   Fax:  [phone]  Name: Ian KempJohn Harper MRN: [id number] Date of Birth: 13-Jan-1981

## 2016-04-21 ENCOUNTER — Encounter: Payer: Self-pay | Admitting: Physical Therapy

## 2016-04-21 NOTE — Addendum Note (Signed)
Addended by: Mariane MastersYEUNG, SHIN-YIING on: 04/21/2016 04:05 PM   Modules accepted: Orders

## 2016-04-21 NOTE — Addendum Note (Signed)
Addended by: Mariane MastersYEUNG, SHIN-YIING on: 04/21/2016 03:46 PM   Modules accepted: Orders

## 2016-04-24 ENCOUNTER — Encounter: Payer: BLUE CROSS/BLUE SHIELD | Admitting: Physical Therapy

## 2016-04-27 ENCOUNTER — Ambulatory Visit: Payer: BLUE CROSS/BLUE SHIELD | Admitting: Physical Therapy

## 2016-05-08 ENCOUNTER — Encounter: Payer: BLUE CROSS/BLUE SHIELD | Admitting: Physical Therapy

## 2016-05-11 ENCOUNTER — Ambulatory Visit: Payer: BLUE CROSS/BLUE SHIELD | Admitting: Physical Therapy

## 2016-05-11 DIAGNOSIS — R278 Other lack of coordination: Secondary | ICD-10-CM

## 2016-05-11 DIAGNOSIS — M6281 Muscle weakness (generalized): Secondary | ICD-10-CM

## 2016-05-11 NOTE — Therapy (Signed)
[place] MAIN [facility] SERVICES [address] [place], [place], [address] Phone: [phone]   Fax:  [phone]  Physical Therapy Treatment  Patient Details  Name: Ian Harper MRN: [id number] Date of Birth: 12-31-1980 No Data Recorded  Encounter Date: 05/11/2016      PT End of Session - 05/11/16 2318    Visit Number 3   Number of Visits 12   Date for PT Re-Evaluation 06/26/16   PT Start Time 1600   PT Stop Time 1710   PT Time Calculation (min) 70 min      Past Medical History:  Diagnosis Date  . Anxiety   . Mitral valve prolapse     Past Surgical History:  Procedure Laterality Date  . APPENDECTOMY      There were no vitals filed for this visit.      Subjective Assessment - 05/11/16 1603    Subjective Pt reported he is still not getting the urge and after he eats he is still gettign pains in the upper stomach like he is getting punched or something. When pt makes time to relax to have bowel movements, pt is able to eliminate after 5 -10 min. The first part of his stool is compacted but looser in the last part of his stool.  Pt will make time to also sit on the toilet at lunchtime.     Pertinent History Pt has been worried about getting medical conditions and researches on the internet which causing more anxiety and fogginess in the brain.  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              [facility] PT Assessment - 05/11/16 2316      Squat   Comments excessive cues for decreased lumbar lordosis, knees behind toes  ( tactile and verbal)      Running   Comments loud landing                     [facility] Adult PT Treatment/Exercise - 05/11/16 2314      Therapeutic Activites    Therapeutic Activities --  see pt instructions     Neuro Re-ed    Neuro Re-ed Details  see pt instructions     Manual Therapy   Internal Pelvic Floor increased tensions at puborectalis 3 o clock (6 oclock =  coccyx) (decreased post Tx                     PT Long Term Goals - 05/11/16 2320      PT LONG TERM GOAL #1   Title Pt will decrease his score on [place] 17% to < 12% in order to improve his QOL   Time 12   Period Weeks   Status On-going     PT LONG TERM GOAL #2   Title Pt will decrease his Zung Anxiety Scale from 56% to < 46% in order to demo improved relaxation skills to minimize relapse of Sx   Time 12   Period Weeks   Status On-going     PT LONG TERM GOAL #3   Title Pt will demo decreased abdominal separation from 3 fingers to < 1 fingers width in order to increase intraabdominal pressure to improve motility and bowel elimination    Time 12   Period Weeks   Status Achieved     PT LONG TERM GOAL #4   Title  Pt will demo no lumbopelvic perturbation with deep core level 2-3 5 reps in order to increase postural stbaility to play sports at summer camp job with less LBP   Time 12   Period Weeks   Status Partially Met     PT LONG TERM GOAL #5   Title Pt will demo soft landing with co activation of deep core mm with running 10 ft without cuing in order to return to running   Time 12   Period Weeks   Status New     Additional Long Term Goals   Additional Long Term Goals Yes     PT LONG TERM GOAL #6   Title Pt will demo no puborectalis mm tightness across 2 visits in order to eliminate bowels with less difficulty.    Time 12   Period Weeks   Status New               Plan - 05/11/16 2318    Clinical Impression Statement Pt  showed improved deep core strength and has progressed to level 3. Pt showed decreased puborectalis mm tensions post intrarectal manual Tx.  Reinforced posture education in sitting, pelvic tilt, and decreased l umbar lordosis in squat to optimize deep core mm system.  Initiated upright finess exercise and running mechanics  training. Pt demo'd correctly following training. Pt continues to benefit from skilled PT.     Rehab Potential Good    Clinical Impairments Affecting Rehab Potential anxiety, limited relaxation strategies   PT Frequency 1x / week   PT Duration 12 weeks   PT Treatment/Interventions ADLs/Self Care Home Management;Aquatic Therapy;Moist Heat;Stair training;Functional mobility training;Gait training;Therapeutic activities;Therapeutic exercise;Patient/family education;Neuromuscular re-education;Traction;Taping;Energy conservation  biopsychosocial approaches, pain science, relaxation techniques   Consulted and Agree with Plan of Care Patient      Patient will benefit from skilled therapeutic intervention in order to improve the following deficits and impairments:  Improper body mechanics, Decreased strength, Hypermobility, Pain, Postural dysfunction, Decreased safety awareness, Decreased coordination, Decreased activity tolerance, Decreased range of motion, Decreased balance, Decreased endurance, Decreased mobility  Visit Diagnosis: Other lack of coordination  Muscle weakness (generalized)     Problem List There are no active problems to display for this patient.   Jerl Mina ,PT, DPT, E-RYT  05/11/2016, 11:26 PM  [place] MAIN [facility] SERVICES [address] [place], [place], [address] Phone: [phone]   Fax:  [phone]  Name: Ian Harper MRN: [id number] Date of Birth: 1980-03-25

## 2016-05-11 NOTE — Patient Instructions (Addendum)
High-Fiber Diet Fiber, also called dietary fiber, is a type of carbohydrate found in fruits, vegetables, whole grains, and beans. A high-fiber diet can have many health benefits. Your health care provider may recommend a high-fiber diet to help:  Prevent constipation. Fiber can make your bowel movements more regular.  Lower your cholesterol.  Relieve hemorrhoids, uncomplicated diverticulosis, or irritable bowel syndrome.  Prevent overeating as part of a weight-loss plan.  Prevent heart disease, type 2 diabetes, and certain cancers. What is my plan? The recommended daily intake of fiber includes:  38 grams for men under age 50.  30 grams for men over age 50.  25 grams for women under age 50.  21 grams for women over age 50. You can get the recommended daily intake of dietary fiber by eating a variety of fruits, vegetables, grains, and beans. Your health care provider may also recommend a fiber supplement if it is not possible to get enough fiber through your diet. What do I need to know about a high-fiber diet?  Fiber supplements have not been widely studied for their effectiveness, so it is better to get fiber through food sources.  Always check the fiber content on thenutrition facts label of any prepackaged food. Look for foods that contain at least 5 grams of fiber per serving.  Ask your dietitian if you have questions about specific foods that are related to your condition, especially if those foods are not listed in the following section.  Increase your daily fiber consumption gradually. Increasing your intake of dietary fiber too quickly may cause bloating, cramping, or gas.  Drink plenty of water. Water helps you to digest fiber. What foods can I eat? Grains  Whole-grain breads. Multigrain cereal. Oats and oatmeal. Brown rice. Barley. Bulgur wheat. Millet. Bran muffins. Popcorn. Rye wafer crackers. Vegetables  Sweet potatoes. Spinach. Kale. Artichokes. Cabbage. Broccoli.  Green peas. Carrots. Squash. Fruits  Berries. Pears. Apples. Oranges. Avocados. Prunes and raisins. Dried figs. Meats and Other Protein Sources  [place]y, kidney, pinto, and soy beans. Split peas. Lentils. Nuts and seeds. Dairy  Fiber-fortified yogurt. Beverages  Fiber-fortified soy milk. Fiber-fortified orange juice. Other  Fiber bars. The items listed above may not be a complete list of recommended foods or beverages. Contact your dietitian for more options.  What foods are not recommended? Grains  White bread. Pasta made with refined flour. White rice. Vegetables  Fried potatoes. Canned vegetables. Well-cooked vegetables. Fruits  Fruit juice. Cooked, strained fruit. Meats and Other Protein Sources  Fatty cuts of meat. Fried poultry or fried fish. Dairy  Milk. Yogurt. Cream cheese. Sour cream. Beverages  Soft drinks. Other  Cakes and pastries. Butter and oils. The items listed above may not be a complete list of foods and beverages to avoid. Contact your dietitian for more information.  What are some tips for including high-fiber foods in my diet?  Eat a wide variety of high-fiber foods.  Make sure that half of all grains consumed each day are whole grains.  Replace breads and cereals made from refined flour or white flour with whole-grain breads and cereals.  Replace white rice with brown rice, bulgur wheat, or millet.  Start the day with a breakfast that is high in fiber, such as a cereal that contains at least 5 grams of fiber per serving.  Use beans in place of meat in soups, salads, or pasta.  Eat high-fiber snacks, such as berries, raw vegetables, nuts, or popcorn. This information is not intended to replace   advice given to you by your health care provider. Make sure you discuss any questions you have with your health care provider. Document Released: 02/02/2005 Document Revised: 07/11/2015 Document Reviewed: 07/18/2013 Elsevier Interactive Patient Education  2017  Elsevier Inc. _______________   Proper sitting (feet under knees)  with pelvic tilts  Sidelying and laying on back pelvic tilts  5 reps   --> Progress Deep core to 3 (marching)   ________________  Continue to make a regular time for bowel movements in the morning, and lunchtime, with pelvic tilts to relax pelvic floor muscles    _______________  10 reps of squats with knees behind toes, less arch in back with fingers on rib and pelvis to sense alignment  X 3 (lift son with this position)   10 reps x 3 with blue band for single leg , bicep curls , pull on exhale, less arch in low back    ________  Jogging without dog  Land soft to coactivate deep core  5 min warm up/ fast walking/ slow pace jog 10 min moderate speed 5 min cool down   + 5 min walking   + stretching following jogging  ___________  Education on digestion/ eliminate physiology Complete food diary

## 2016-05-22 ENCOUNTER — Encounter: Payer: BLUE CROSS/BLUE SHIELD | Admitting: Physical Therapy

## 2016-05-29 ENCOUNTER — Encounter: Payer: BLUE CROSS/BLUE SHIELD | Admitting: Physical Therapy

## 2016-06-01 ENCOUNTER — Ambulatory Visit: Payer: BLUE CROSS/BLUE SHIELD | Attending: Gastroenterology | Admitting: Physical Therapy

## 2016-06-01 DIAGNOSIS — R278 Other lack of coordination: Secondary | ICD-10-CM | POA: Diagnosis not present

## 2016-06-01 DIAGNOSIS — M6281 Muscle weakness (generalized): Secondary | ICD-10-CM

## 2016-06-01 NOTE — Patient Instructions (Addendum)
Pelvic tilt in sitting- lifting gluts out of the way to be on sitting bones   Pelvic tilt in squats - tilt pelvis instead bending over   Figure -4 stretch 5 breaths leaning forward over thigh 5 breaths when putting on shoes  _  Progress to deep core level 3 ( marching)

## 2016-06-01 NOTE — Therapy (Signed)
[place] MAIN [facility] SERVICES [address] [place], [place], [address] Phone: [phone]   Fax:  [phone]  Physical Therapy Treatment  Patient Details  Name: Ian Harper MRN: [id number] Date of Birth: [birth year] No Data Recorded  Encounter Date: 06/01/2016      PT End of Session - 06/01/16 1655    Visit Number 4   Number of Visits 12   Date for PT Re-Evaluation 06/26/16   PT Start Time 0932   PT Stop Time 1704   PT Time Calculation (min) 51 min   Activity Tolerance Patient tolerated treatment well;No increased pain   Behavior During Therapy WFL for tasks assessed/performed      Past Medical History:  Diagnosis Date  . Anxiety   . Mitral valve prolapse     Past Surgical History:  Procedure Laterality Date  . APPENDECTOMY      There were no vitals filed for this visit.      Subjective Assessment - 06/01/16 1617    Subjective Pt reports he has had two occasion with the urge to have a bowel movement and he did not have to strain.    Pertinent History Pt has been worried about getting medical conditions and researches on the internet which causing more anxiety and fogginess in the brain.  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                        Pelvic Floor Special Questions - 06/01/16 1656    Pelvic Floor Internal Exam pt consented without contraindications    Exam Type Rectal   Palpation no pelvic floor mm tensions at puborectalis 3 o clock, minor tenderness at coccyx L lateral edge ( decreased post Tx)   required pelvic tilt, diaphragmatic breathing for lengthenin           [facility] Adult PT Treatment/Exercise - 06/01/16 1658      Neuro Re-ed    Neuro Re-ed Details  See pt instructions     Manual Therapy   Internal Pelvic Floor reassessed pelvic floor                 PT Education - 06/01/16 1655    Education provided Yes   Education Details HEP    Person(s) Educated Patient   Methods Explanation;Demonstration;Tactile cues;Verbal cues;Handout   Comprehension Returned demonstration;Verbalized understanding             PT Long Term Goals - 05/11/16 2320      PT LONG TERM GOAL #1   Title Pt will decrease his score on [place] 17% to < 12% in order to improve his QOL   Time 12   Period Weeks   Status On-going     PT LONG TERM GOAL #2   Title Pt will decrease his Zung Anxiety Scale from 56% to < 46% in order to demo improved relaxation skills to minimize relapse of Sx   Time 12   Period Weeks   Status On-going     PT LONG TERM GOAL #3   Title Pt will demo decreased abdominal separation from 3 fingers to < 1 fingers width in order to increase intraabdominal pressure to improve motility and bowel elimination    Time 12   Period Weeks   Status Achieved     PT LONG TERM GOAL #4   Title Pt will demo no lumbopelvic perturbation with  deep core level 2-3 5 reps in order to increase postural stbaility 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    Time 12   Period Weeks   Status Achieved               Plan - 06/01/16 1658    Clinical Impression Statement Pt has regained sensation to have a bowel movement across 2 occasions since last session. Pt continues to make time for bowel movements which are now occuring daily bowel movements 4x/ week. Pt demo'd good carry over with decreased pelvic floor mm tensions. Pt required propioception of coccyx in sidelying and  sitting to promote anterior tilt of pelvis. Provided education to understand his progress and answered questions he had about info he found on the internet. Advised pt to rely less on the internet and to build confidence with his body, self-care practice, and HEP. Pt voiced understanding and agreement.. Progressed to deep core level 3 with minimal cues.  Incorporated seated pelvic tilts in HEP and functional activities. Pt demo'd properly. Pt will continue to benefit from skilled PT.     Rehab Potential Good   Clinical Impairments Affecting Rehab Potential anxiety, limited relaxation strategies   PT Frequency 1x / week   PT Duration 12 weeks   PT Treatment/Interventions ADLs/Self Care Home Management;Aquatic Therapy;Moist Heat;Stair training;Functional mobility training;Gait training;Therapeutic activities;Therapeutic exercise;Patient/family education;Neuromuscular re-education;Traction;Taping;Energy conservation  biopsychosocial approaches, pain science, relaxation techniques   Consulted and Agree with Plan of Care Patient      Patient will benefit from skilled therapeutic intervention in order to improve the following deficits and impairments:  Improper body mechanics, Decreased strength, Hypermobility, Pain, Postural dysfunction, Decreased safety awareness, Decreased coordination, Decreased activity tolerance, Decreased range of motion, Decreased balance, Decreased endurance, Decreased mobility  Visit Diagnosis: Other lack of coordination  Muscle weakness (generalized)     Problem List There are no active problems to display for this patient.   Jerl Mina ,PT, DPT, E-RYT  06/01/2016, 5:05 PM  [place] MAIN [facility] SERVICES [address] [place], [place], [address] Phone: [phone]   Fax:  [phone]  Name: Ian Harper MRN: [id number] Date of Birth: 1981-02-02

## 2016-06-05 ENCOUNTER — Encounter: Payer: BLUE CROSS/BLUE SHIELD | Admitting: Physical Therapy

## 2016-06-15 ENCOUNTER — Ambulatory Visit: Payer: BLUE CROSS/BLUE SHIELD | Admitting: Physical Therapy

## 2016-06-16 ENCOUNTER — Encounter: Payer: Self-pay | Admitting: Physical Therapy

## 2016-06-16 DIAGNOSIS — R278 Other lack of coordination: Secondary | ICD-10-CM

## 2016-06-16 DIAGNOSIS — M6281 Muscle weakness (generalized): Secondary | ICD-10-CM

## 2016-06-16 NOTE — Therapy (Signed)
[place] MAIN [facility] SERVICES [address] [place], [place], [address] Phone: [phone]   Fax:  [phone]  Patient Details  Name: Ian Harper MRN: [id number] Date of Birth: 08/28/1980 Referring Provider:  Renita Papa  Encounter Date: 06/16/2016   Discharge Summary     Across 4 visits, pt has shown great improvement with his ability to regain urge and to eliminate bowels completely.He has achieved 4/6 goals. The remaining goals were unable to be assessed. At his last visit, he stated he regained the sensation to have a bowel movement across 2 occasions. Pt continues to make time for bowel movements which are now occuring daily bowel movements 4x/ week. Pt demo'd good carry over with decreased pelvic floor mm tensions and resolved diastasis recti. Pt required propioception of coccyx in sidelying and sitting to promote anterior tilt of pelvis. Provided education to understand his progress and answered questions he had about info he found on the internet. Advised pt to rely less on the internet and to build confidence with his body, self-care practice, and HEP. Pt voiced understanding and agreement.    Pt is ready to be d/c at this time.  Thank you for your referral!       PT Long Term Goals - 06/01/16 1707      PT LONG TERM GOAL #1   Title Pt will decrease his score on [place] 17% to < 12% in order to improve his QOL   Time 12   Period Weeks   Unable to  Unable to assess     PT LONG TERM GOAL #2   Title Pt will decrease his Zung Anxiety Scale from 56% to < 46% in order to demo improved relaxation skills to minimize relapse of Sx   Time 12   Period Weeks   Status Unable to assess     PT LONG TERM GOAL #3   Title Pt will demo decreased abdominal separation from 3 fingers to < 1 fingers width in order to increase intraabdominal pressure to improve motility and bowel elimination    Time 12   Period Weeks   Status Achieved     PT  LONG TERM GOAL #4   Title Pt will demo no lumbopelvic perturbation with deep core level 2-3 5 reps in order to increase postural 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    Time 12   Period Weeks   Status Achieved          Mariane Masters ,PT, DPT, E-RYT  06/16/2016, 9:14 AM  [place] [facility] MAIN [facility] SERVICES [address] [place], [place], [address] Phone: [phone]   Fax:  [phone]

## 2016-12-04 IMAGING — CR DG CHEST 2V
2 series · 2 of 2 positions shown · non-contrast
Comparison: None

CLINICAL DATA: Increased urination, heart palpitations at night
that Ari Aleksandr him up, mid chest pain, history mitral valve prolapse,
Smoker

EXAM:
CHEST  2 VIEW

[chest pa]
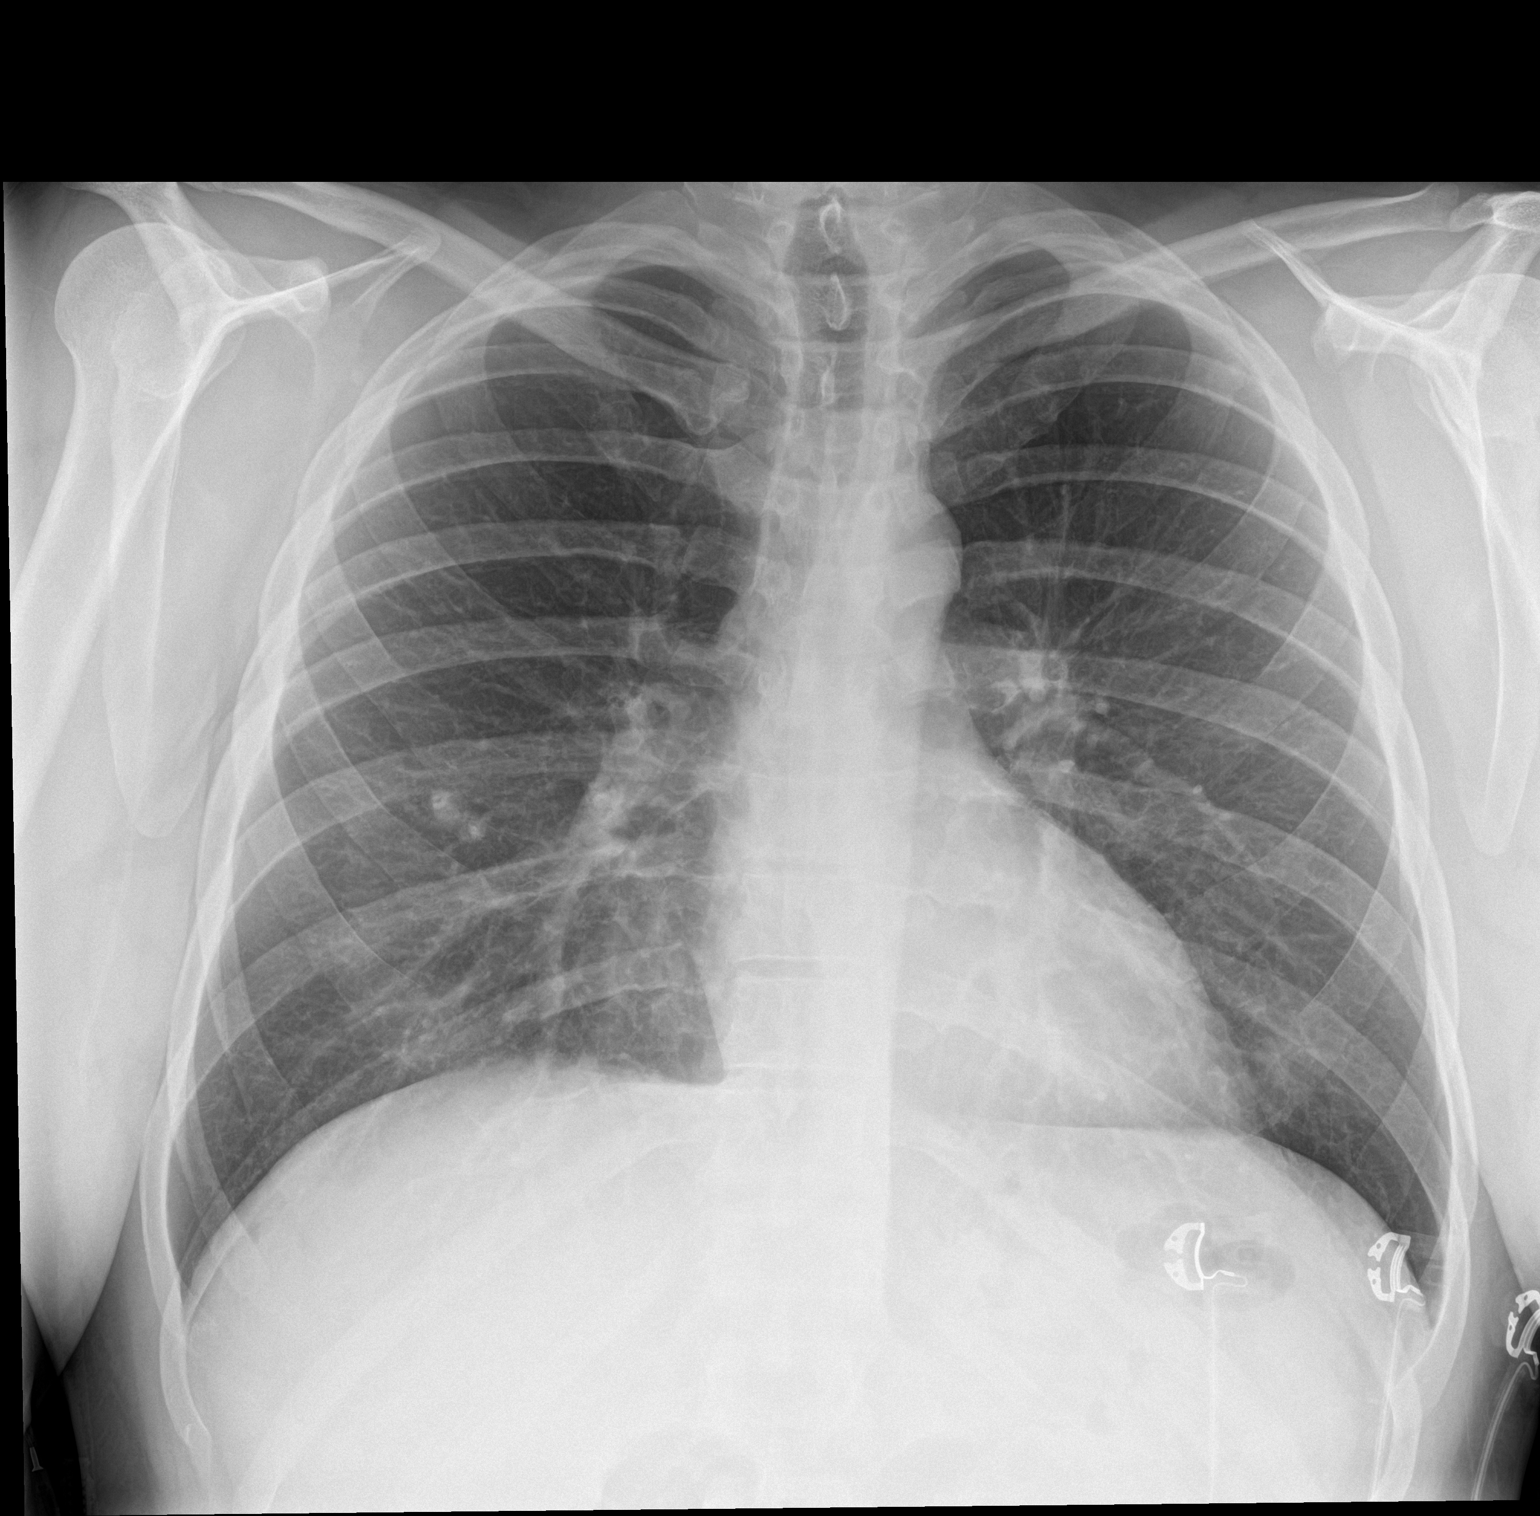

[chest lat]
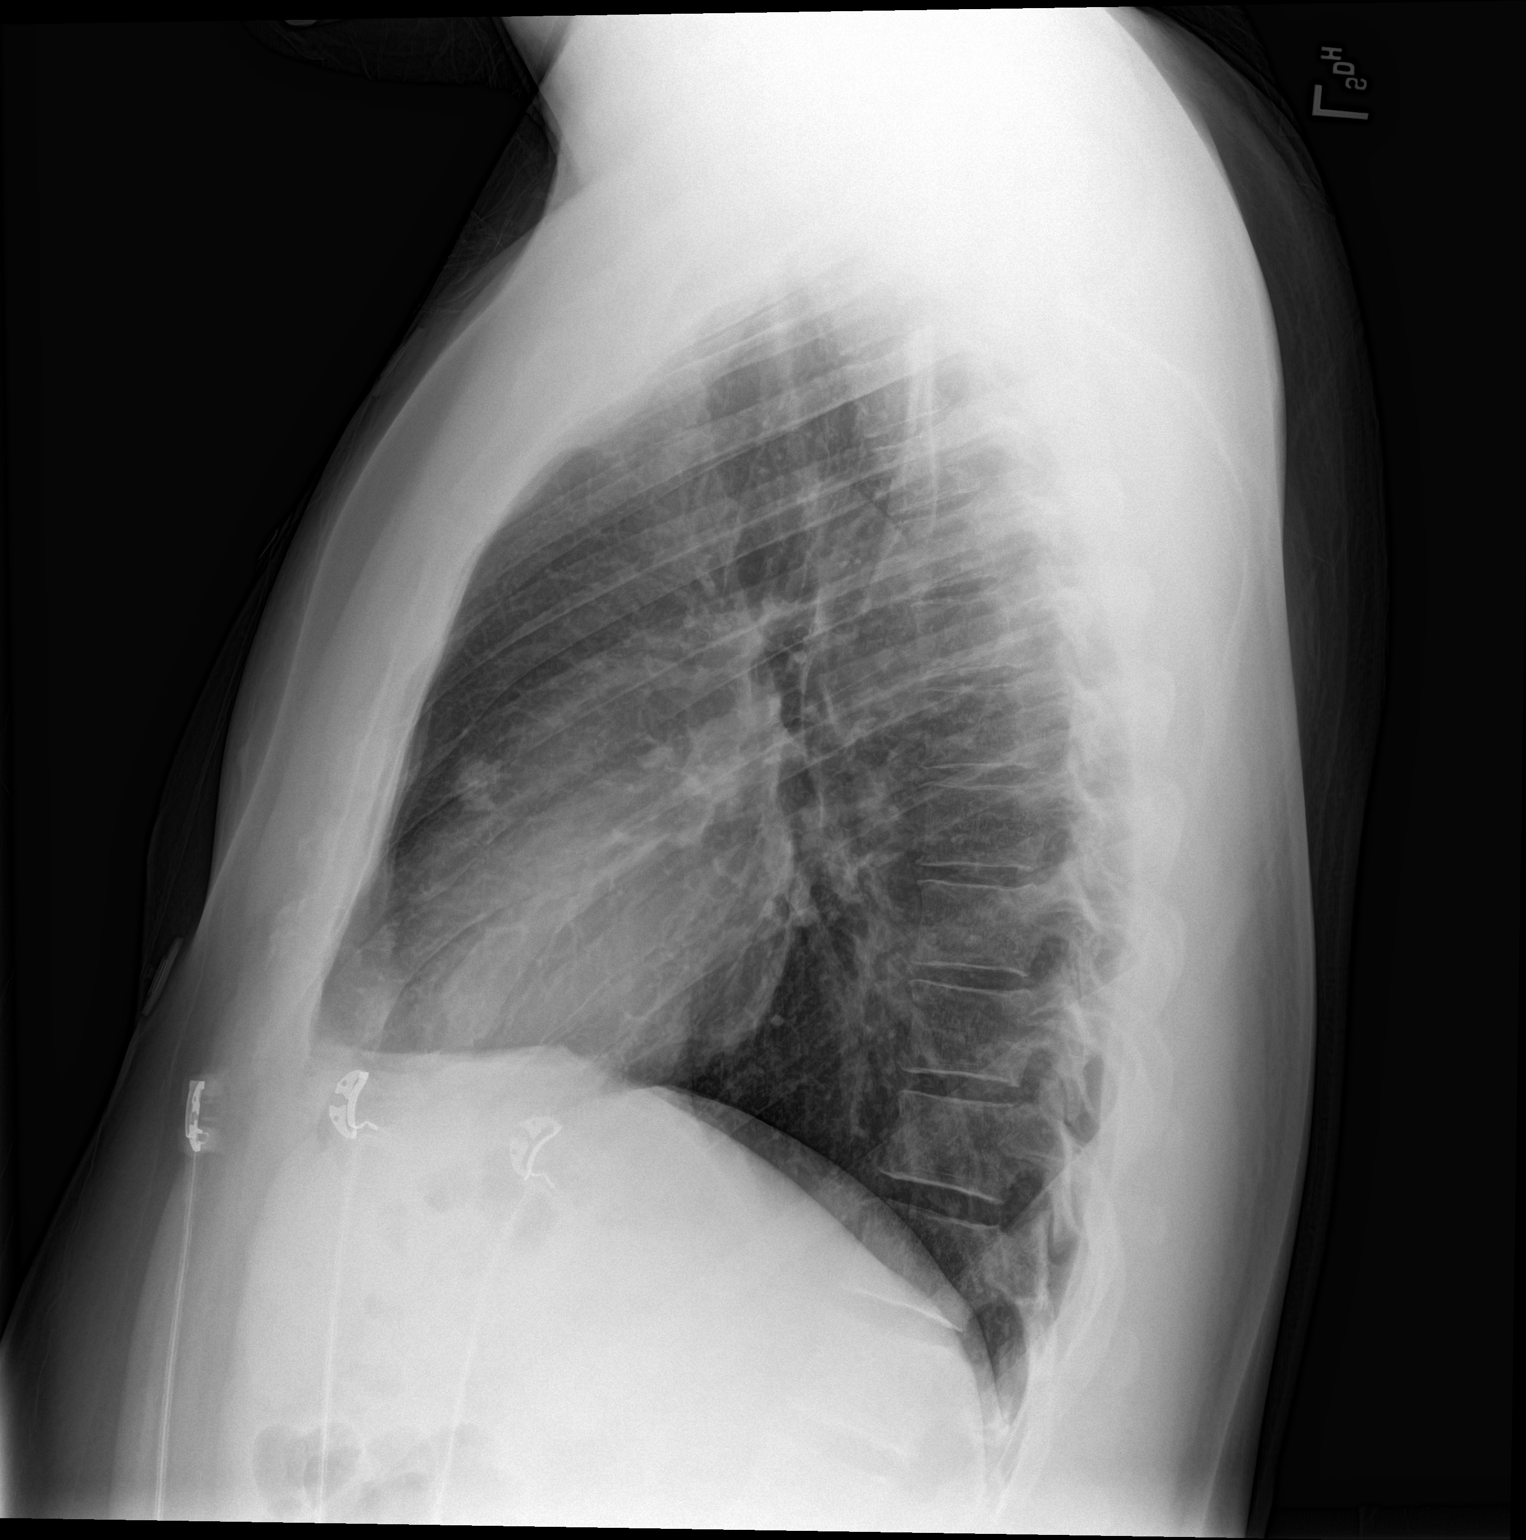

[2 of 2 positions shown; findings below may reference images not displayed]

FINDINGS: Normal heart size, mediastinal contours and pulmonary vascularity.

Minimal peribronchial thickening.

Calcified granulomata RIGHT middle lobe.

Question calcified lymph node at LEFT hilum.

Lungs otherwise clear.

No definite pulmonary infiltrate, pleural effusion or pneumothorax.

Bones unremarkable.
IMPRESSION: Old granulomatous disease and mild bronchitic changes without acute
infiltrate.

## 2017-03-16 IMAGING — CR DG CHEST 2V
1 series · 2 of 2 positions shown · non-contrast
Comparison: 04/25/2015.  Abdomen and pelvis CT from 02/21/2010.

CLINICAL DATA: Lung nodule.

EXAM:
CHEST  2 VIEW

[Series 1: dg chest 2 view · 0.14mm/px · 2 of 2 slices shown]
[im 1/2]
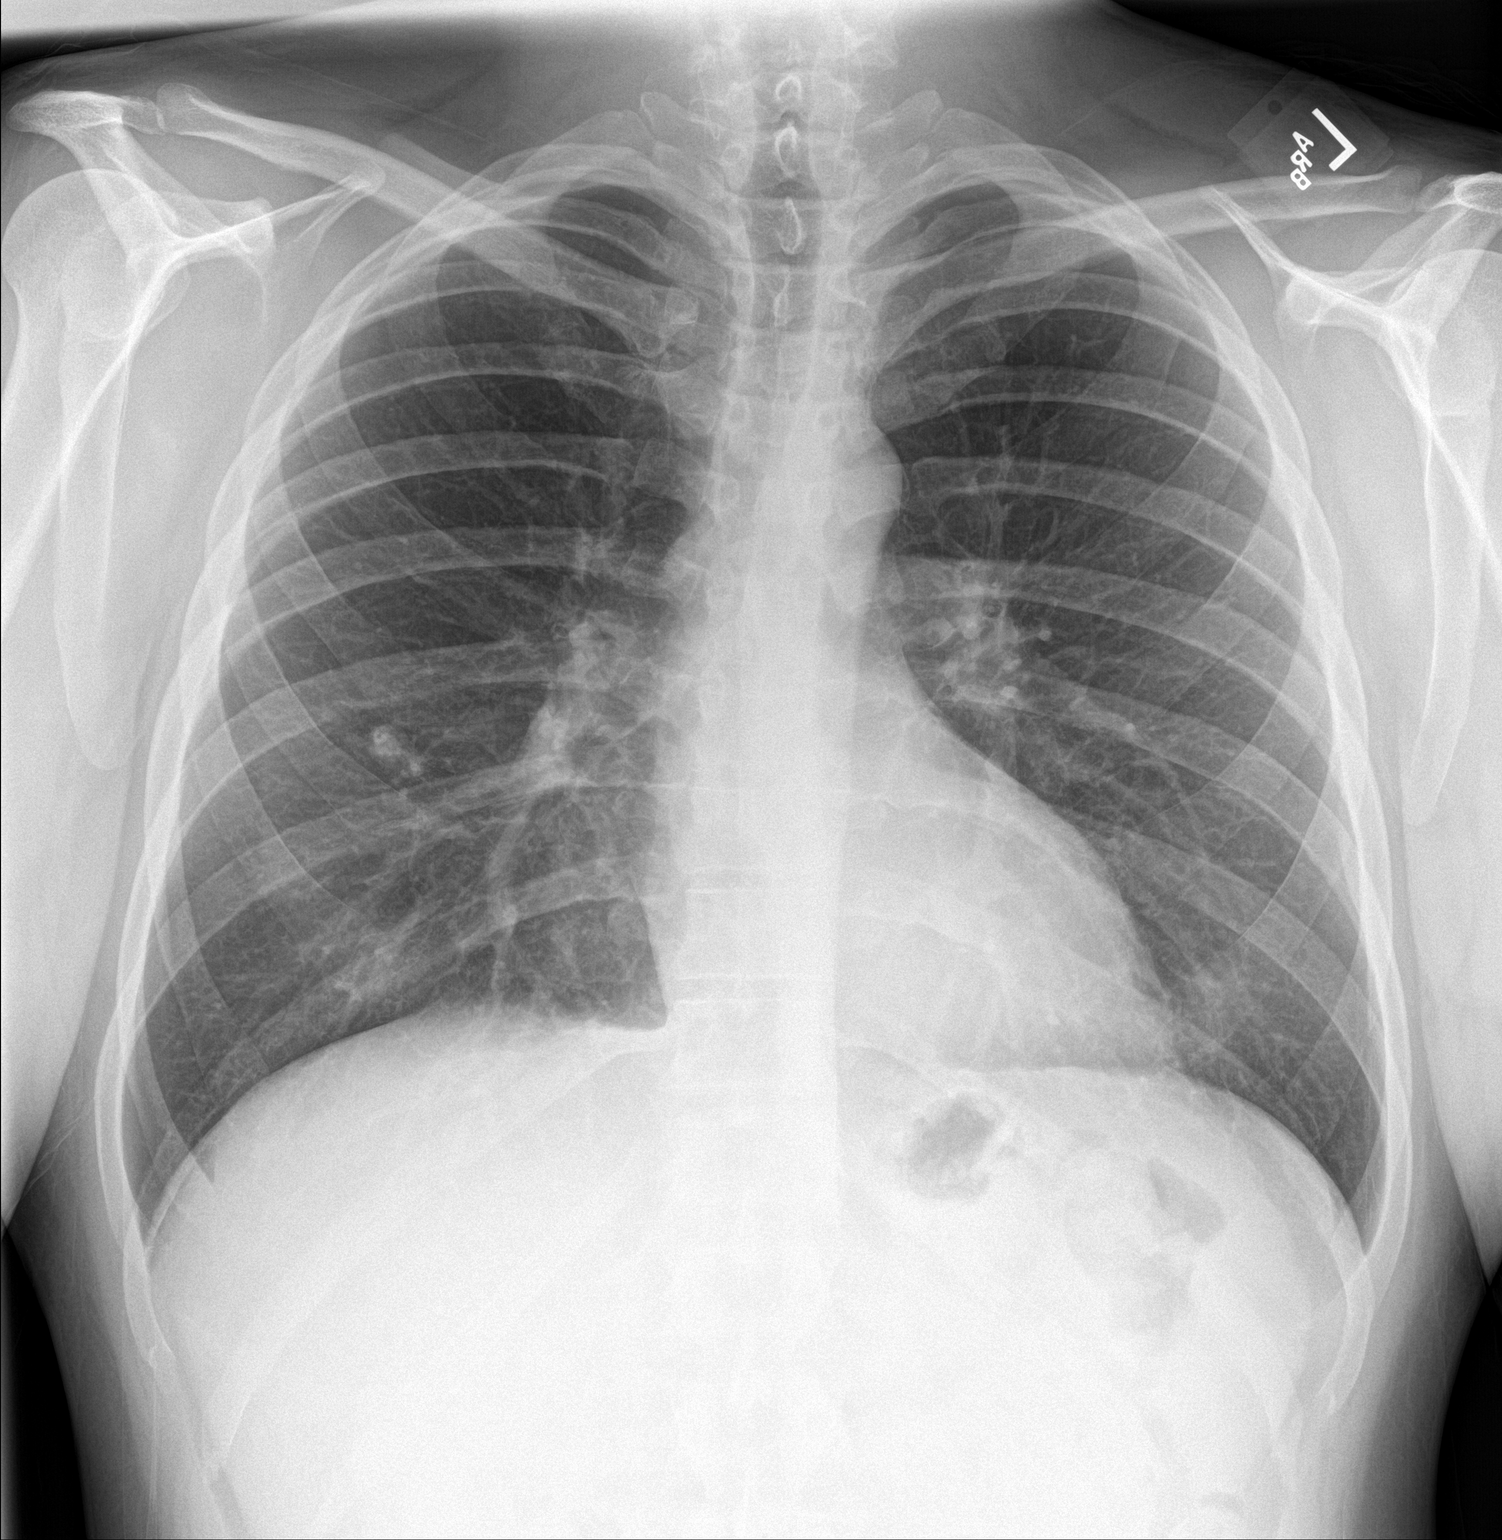
[im 2/2]
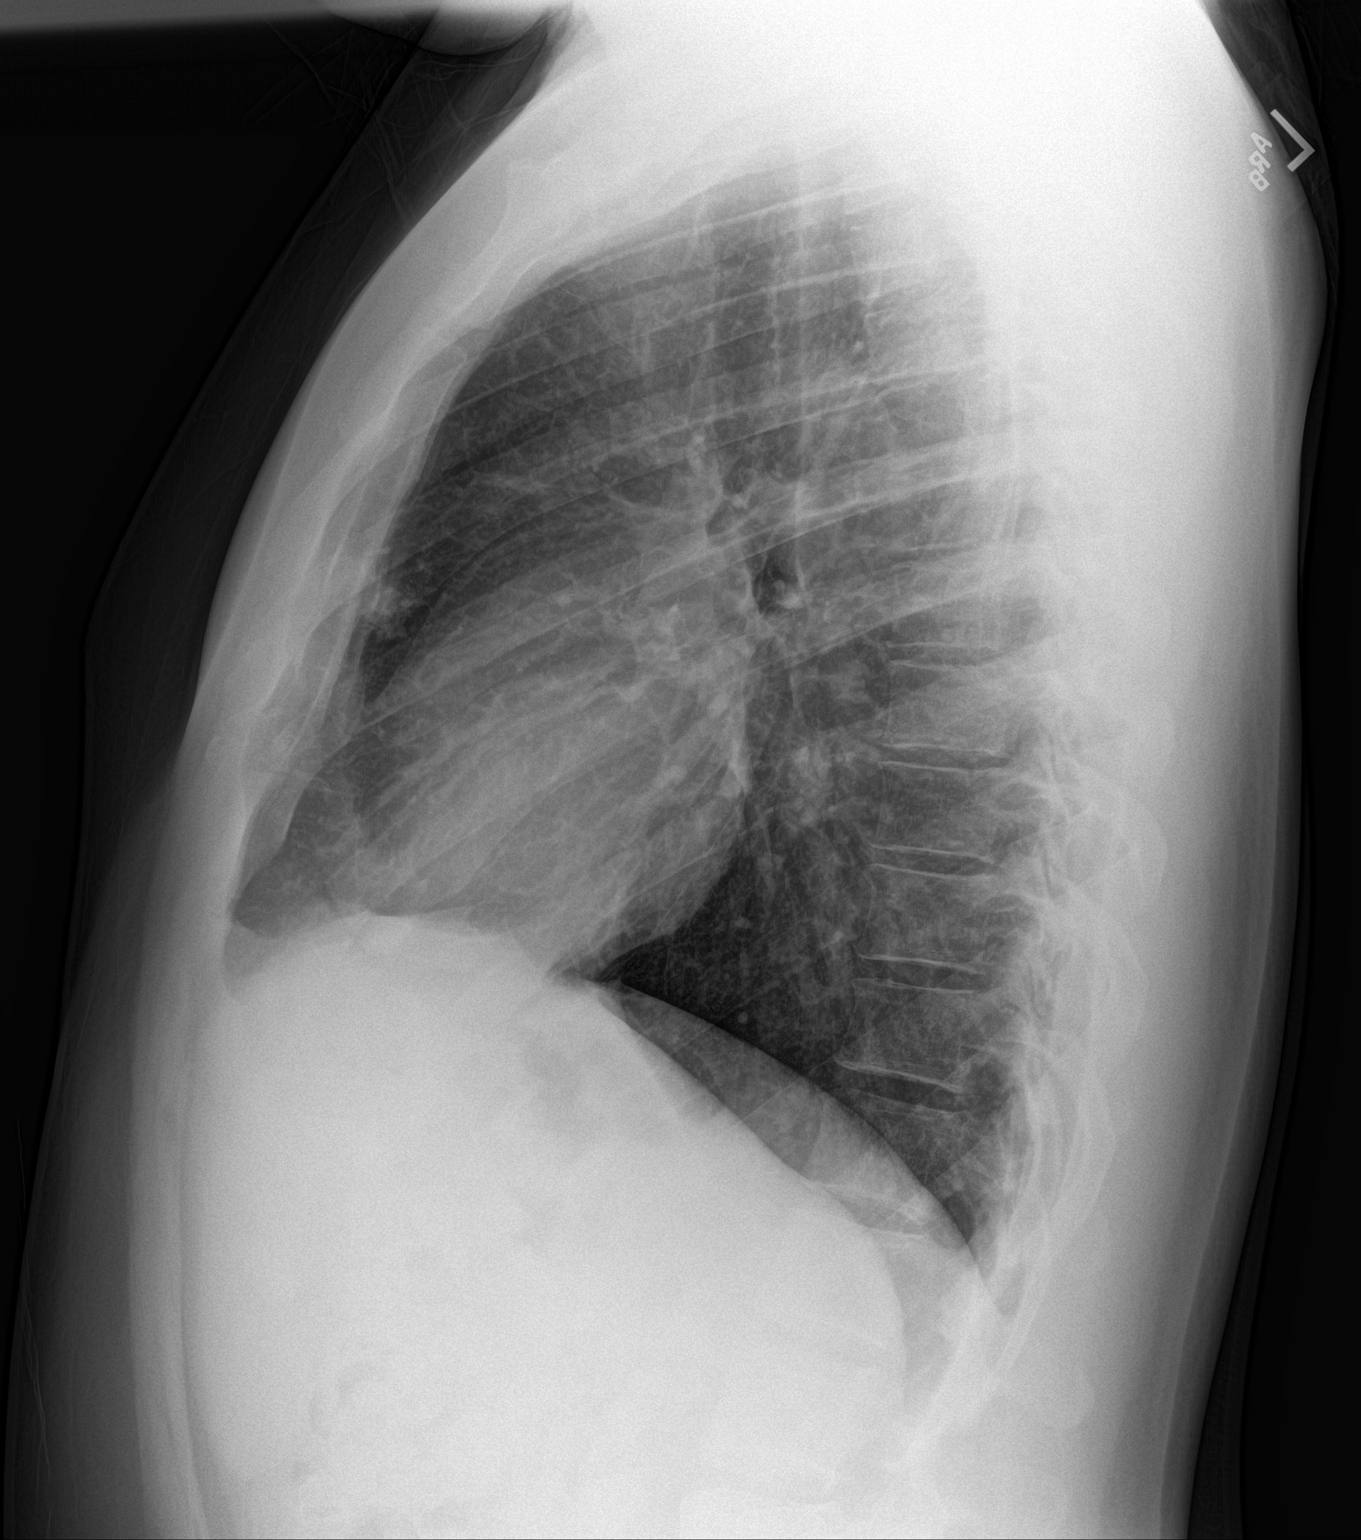

[2 of 2 positions shown; findings below may reference images not displayed]

FINDINGS: Two view exam of the chest again shows a anterior right upper lobe
pulmonary nodule, superimposed on the retrosternal lung on lateral
projection. Lungs otherwise normal, without focal airspace
consolidation, pulmonary edema, or evidence of pleural effusion. The
cardiopericardial silhouette is within normal limits for size. The
visualized bony structures of the thorax are intact.
IMPRESSION: Stable exam. Right pulmonary nodule without new or progressive
findings. Although the pulmonary nodule was not evident on axial
imaging from the CT scan performed 5 years ago, it was visible on
the scout image and the 5.5 years of stability is consistent with a
benign etiology, likely granuloma.

## 2017-06-23 ENCOUNTER — Telehealth: Payer: Self-pay | Admitting: Internal Medicine

## 2017-06-23 ENCOUNTER — Encounter: Payer: Self-pay | Admitting: Internal Medicine

## 2017-06-23 NOTE — Telephone Encounter (Signed)
3 attempts to schedule fu appt from recall list.   Deleting recall.  °Mailed Letter  °

## 2017-08-13 NOTE — Progress Notes (Signed)
[facility] Consultation      Assessment and Plan:  Hilar lymphadenopathy. -This is likely related to granulomatous disease, which may relate to previous infection or other conditions  Ace level, urine histo antigen negative.  -The patient's lymphadenopathy appears to have calcified, pointing to a likely benign cause.   Allergic rhinitis and allergic asthma.  -He notes that he has episodes of dyspnea which occur for a few days, every 3 to 4 weeks. -Recommend that he remove the pets from the bedroom, recommend daily antihistamine. - Given a prescription for albuterol inhaler to be used as needed.  The symptoms persist for longer than 1 to 2 days he is asked to call us for longer acting inhalers such as a steroid inhaler.  Lung nodule. -Review of previous chest x-rays and CT films from 2002 and 2003 show evidence of nodule in the right middle lobe that time, this appears to have progressed since that time. -Given 15 year history of these nodules, this likely represents a benign process, we'll continue to follow with yearly chest x-rays.  Meds ordered this encounter  Medications  . albuterol (PROAIR HFA) 108 (90 Base) MCG/ACT inhaler    Sig: Inhale 1-2 puffs into the lungs every 6 (six) hours as needed for wheezing or shortness of breath.    Dispense:  1 Inhaler    Refill:  5     Return in about 1 year (around 08/17/2018).   Date: 08/13/2017  MRN# [id number] Ian Harper 1980/07/16  Referring Physician:   Josip Merolla is a 37 y.o. old male seen in consultation for chief complaint of:    Chief Complaint  Patient presents with  . Follow-up    pt here for f/u with no complaints: denies sob,cough, wheezeing. Pt took Azith but did not take prednisione.    HPI:  The patient is a 37 year old male with history of hilar lymphadenopathy and likely granulomatous disease.  He was last seen in May 2017, he was sent for a urine histo antigen and ACE level to look for any  evidence of active processes, which were negative.  Today he notes that occasionally he has dyspnea, coughing, sneezing and wheezing, this seems to happen more when he is around his cat. He has never had allergy testing. This happens most often in the fall or early spring. When it comes it lasts for a few days, usually at night.  He denies reflux, occasionally get sinus drainage. He has never taken a nasal spray. Cats are occasionally in bed.   He has never taken an antihistamine.   **ACE 08/05/15>>44 **Urine Histo Ag>>0  Review of chest x-ray film from 04/25/15: No other images are available for viewing. There is left calcified hilar lymphadenopathy, also seen on the right side, as well as a possible nodule in the right mid zone. -PPD test 07/05/15 results reviewed, there is 0 mm of induration. -Lab testing including CBC, TSH drawn on 07/05/15, normal -Review of previous CT films were visualized: Scans from 06/11/2000, at that time, there appeared to be a 1.5-2 cm right middle lobe nodule did not notice any calcified lymph nodes in procedures at that time, there are no calcified lymph nodes within the liver, no calcified lymphadenopathy or nodules seen in addition, I see minimal mediastinal or hilar lymphadenopathy. Chest x-ray 2 view on 04/15/00. Hilar fullness, right middle lobe nodule seen Chest x-ray 2 view 06/13/01, mild progression from changes seen previously, early calcification seen in the left hilar area.  Review of Systems:  Constitutional: Feels well. Cardiovascular: No chest pain.  Pulmonary: Denies dyspnea.   The remainder of systems were reviewed and were found to be negative other than what is documented in the HPI.    Physical Examination:   VS: BP 112/70 (BP Location: Left Arm, Cuff Size: Normal)   Pulse 63   Resp 16   Ht 5\' 9"  (1.753 m)   Wt 198 lb (89.8 kg)   SpO2 98%   BMI 29.24 kg/m   General Appearance: No distress  Neuro:without focal findings, mental status,  speech normal, alert and oriented HEENT: PERRLA, EOM intact Pulmonary: No wheezing, No rales  CardiovascularNormal S1,S2.  No m/r/g.  Abdomen: Benign, Soft, non-tender, No masses Renal:  No costovertebral tenderness  GU:  No performed at this time. Endoc: No evident thyromegaly, no signs of acromegaly or Cushing features Skin:   warm, no rashes, no ecchymosis  Extremities: normal, no cyanosis, clubbing.     Allergies:  Patient has no known allergies.   Other:  All other systems were reviewed with the patient and were negative other that what is mentioned in the HPI.       LABORATORY PANEL:   CBC No results for input(s): WBC, HGB, HCT, PLT in the last 168 hours. ------------------------------------------------------------------------------------------------------------------  Chemistries  No results for input(s): NA, K, CL, CO2, GLUCOSE, BUN, CREATININE, CALCIUM, MG, AST, ALT, ALKPHOS, BILITOT in the last 168 hours.  Invalid input(s): GFRCGP ------------------------------------------------------------------------------------------------------------------  Cardiac Enzymes No results for input(s): TROPONINI in the last 168 hours. ------------------------------------------------------------  RADIOLOGY:  No results found.     Thank  you for the consultation and for allowing [facility] to assist in the care of your patient. Our recommendations are noted above.  Please contact us if we can be of further service.  Wells Guileseep Ustin Cruickshank, M.D., F.C.C.P.  Board Certified in Internal Medicine, Pulmonary Medicine, Critical Care Medicine, and Sleep Medicine.  [facility] Office Number: [phone]  08/13/2017

## 2017-08-16 ENCOUNTER — Encounter: Payer: Self-pay | Admitting: Internal Medicine

## 2017-08-16 ENCOUNTER — Ambulatory Visit: Payer: BLUE CROSS/BLUE SHIELD | Admitting: Internal Medicine

## 2017-08-16 VITALS — BP 112/70 | HR 63 | Resp 16 | Ht 69.0 in | Wt 198.0 lb

## 2017-08-16 DIAGNOSIS — J4540 Moderate persistent asthma, uncomplicated: Secondary | ICD-10-CM | POA: Diagnosis not present

## 2017-08-16 MED ORDER — ALBUTEROL SULFATE HFA 108 (90 BASE) MCG/ACT IN AERS: 1.0000 | INHALATION_SPRAY | Freq: Four times a day (QID) | RESPIRATORY_TRACT | 5 refills | Status: DC | PRN

## 2017-08-16 NOTE — Patient Instructions (Signed)
Use albuterol inhaler 1-2 puffs as needed when symptoms arise. If not improved within 1-2 days call our office.  Start an over the counter antihistamine.

## 2017-10-29 ENCOUNTER — Encounter: Payer: Self-pay | Admitting: Family Medicine

## 2017-10-29 ENCOUNTER — Ambulatory Visit: Payer: BLUE CROSS/BLUE SHIELD | Admitting: Family Medicine

## 2017-10-29 VITALS — BP 120/80 | HR 64 | Temp 98.2°F | Resp 16 | Ht 69.0 in | Wt 202.0 lb

## 2017-10-29 DIAGNOSIS — Z83438 Family history of other disorder of lipoprotein metabolism and other lipidemia: Secondary | ICD-10-CM

## 2017-10-29 DIAGNOSIS — J3081 Allergic rhinitis due to animal (cat) (dog) hair and dander: Secondary | ICD-10-CM

## 2017-10-29 DIAGNOSIS — K1379 Other lesions of oral mucosa: Secondary | ICD-10-CM

## 2017-10-29 DIAGNOSIS — Z0000 Encounter for general adult medical examination without abnormal findings: Secondary | ICD-10-CM

## 2017-10-29 DIAGNOSIS — K130 Diseases of lips: Secondary | ICD-10-CM | POA: Insufficient documentation

## 2017-10-29 DIAGNOSIS — I341 Nonrheumatic mitral (valve) prolapse: Secondary | ICD-10-CM

## 2017-10-29 DIAGNOSIS — F411 Generalized anxiety disorder: Secondary | ICD-10-CM | POA: Insufficient documentation

## 2017-10-29 DIAGNOSIS — R739 Hyperglycemia, unspecified: Secondary | ICD-10-CM | POA: Insufficient documentation

## 2017-10-29 DIAGNOSIS — Z803 Family history of malignant neoplasm of breast: Secondary | ICD-10-CM | POA: Diagnosis not present

## 2017-10-29 MED ORDER — BUSPIRONE HCL 5 MG PO TABS: 5.0000 mg | ORAL_TABLET | Freq: Three times a day (TID) | ORAL | 1 refills | Status: DC

## 2017-10-29 NOTE — Assessment & Plan Note (Signed)
Multiple family members with early strokes in their 7650s related to high cholesterol We will screen annual lipid panel Discussed diet and exercise

## 2017-10-29 NOTE — Patient Instructions (Signed)
Mucocele of the Mouth A mucocele is a growth or bump (cyst) that is filled with mucus. A mucocele can form on various parts of the mouth, including the gums, the tongue, and the inside of the cheeks. A common spot is inside the lower lip. Mucoceles are not dangerous, and they are usually not painful. A mucocele can be uncomfortable if it is very large or if it is located under the tongue. Small mucoceles often clear up on their own. Treatment may not be needed. Mucoceles that are bigger or that keep coming back might need to be removed. What are the causes? Mucoceles form when the salivary ducts in the mouth are damaged and leak saliva. These ducts carry saliva from the salivary glands to the surface of the mouth. A mucocele can develop because of:  An injury to your mouth.  Sucking or biting on your lips or tongue.  A blocked salivary duct. This is sometimes caused by swelling.  Piercing of the tongue or lip for jewelry.  In some cases, the cause may not be known. What are the signs or symptoms? Symptoms of this condition include a smooth, painless bumpinside your mouth. The bump may:  Show up all of a sudden.  Have thin walls and a bluish color.  Change in size. Most are smaller than  inch.  Mucoceles under the tongue are called ranulas. These may be bigger and may push your tongue up and back. In some cases, this can make it hard to talk, swallow, or breathe. How is this diagnosed? This condition is usually diagnosed with a physical exam. Your health care provider will often be able to tell if you have a mucocele by looking at it and feeling it. You may also have tests, such as:  An ultrasound to check for problems with your salivary gland.  An X-ray to see if stones are blocking the exit of saliva.  How is this treated? Treatment may depend on the size of the mucocele:  For a small mucocele, treatment is usually not needed. It will drain on its own and go away.  For larger  mucoceles and ranulas, surgery may be needed. This may be done if the mucocele does not go away or if it keeps coming back. The entire mucocele may be taken out. In some cases, the salivary gland may also be removed.  Follow these instructions at home:  Take over-the-counter and prescription medicines only as told by your health care provider.  Do not try to drain a mucocele on your own. Do not poke a hole in it.  Do not use any tobacco products, such as cigarettes, chewing tobacco, and e-cigarettes. If you need help quitting, ask your health care provider.  Do not suck or bite on your lips or tongue.  If your mucocele was removed, avoid hard or spicy foods and foods that have high acidity while your mouth is healing.  Keep all follow-up visits as told by your health care provider. This is important. Contact a health care provider if:  You have a lump or cyst inside your mouth that does not go away.  You have a fever. Get help right away if:  You have a lump or cyst in your mouth that: ? Becomes painful. ? Gets large very fast.  You have a lump or cyst in your mouth that makes it hard to: ? Swallow. ? Talk. ? Breathe. This information is not intended to replace advice given to you by your   health care provider. Make sure you discuss any questions you have with your health care provider. Document Released: 03/01/2015 Document Revised: 07/11/2015 Document Reviewed: 04/30/2014 Elsevier Interactive Patient Education  2018 [place] 18-39 Years, Male Preventive care refers to lifestyle choices and visits with your health care provider that can promote health and wellness. What does preventive care include?  A yearly physical exam. This is also called an annual well check.  Dental exams once or twice a year.  Routine eye exams. Ask your health care provider how often you should have your eyes checked.  Personal lifestyle choices, including: ? Daily care  of your teeth and gums. ? Regular physical activity. ? Eating a healthy diet. ? Avoiding tobacco and drug use. ? Limiting alcohol use. ? Practicing safe sex. What happens during an annual well check? The services and screenings done by your health care provider during your annual well check will depend on your age, overall health, lifestyle risk factors, and family history of disease. Counseling Your health care provider may ask you questions about your:  Alcohol use.  Tobacco use.  Drug use.  Emotional well-being.  Home and relationship well-being.  Sexual activity.  Eating habits.  Work and work Statistician.  Screening You may have the following tests or measurements:  Height, weight, and BMI.  Blood pressure.  Lipid and cholesterol levels. These may be checked every 5 years starting at age 47.  Diabetes screening. This is done by checking your blood sugar (glucose) after you have not eaten for a while (fasting).  Skin check.  Hepatitis C blood test.  Hepatitis B blood test.  Sexually transmitted disease (STD) testing.  Discuss your test results, treatment options, and if necessary, the need for more tests with your health care provider. Vaccines Your health care provider may recommend certain vaccines, such as:  Influenza vaccine. This is recommended every year.  Tetanus, diphtheria, and acellular pertussis (Tdap, Td) vaccine. You may need a Td booster every 10 years.  Varicella vaccine. You may need this if you have not been vaccinated.  HPV vaccine. If you are 70 or younger, you may need three doses over 6 months.  Measles, mumps, and rubella (MMR) vaccine. You may need at least one dose of MMR.You may also need a second dose.  Pneumococcal 13-valent conjugate (PCV13) vaccine. You may need this if you have certain conditions and have not been vaccinated.  Pneumococcal polysaccharide (PPSV23) vaccine. You may need one or two doses if you smoke  cigarettes or if you have certain conditions.  Meningococcal vaccine. One dose is recommended if you are age 63-21 years and a first-year college student living in a residence hall, or if you have one of several medical conditions. You may also need additional booster doses.  Hepatitis A vaccine. You may need this if you have certain conditions or if you travel or work in places where you may be exposed to hepatitis A.  Hepatitis B vaccine. You may need this if you have certain conditions or if you travel or work in places where you may be exposed to hepatitis B.  Haemophilus influenzae type b (Hib) vaccine. You may need this if you have certain risk factors.  Talk to your health care provider about which screenings and vaccines you need and how often you need them. This information is not intended to replace advice given to you by your health care provider. Make sure you discuss any questions you have with  your health care provider. Document Released: 03/31/2001 Document Revised: 10/23/2015 Document Reviewed: 12/04/2014 Elsevier Interactive Patient Education  [employer].

## 2017-10-29 NOTE — Assessment & Plan Note (Signed)
Chronic and asymptomatic Can use OTC antihistamines and Flonase as needed Unable to avoid his allergen triggers

## 2017-10-29 NOTE — Assessment & Plan Note (Signed)
Chronic and stable It is affecting his day-to-day life We will start BuSpar 5 mg 3 times daily He can use this as needed, but discussed that it is more effective if taken regularly Discussed avoidance of chronic benzo therapy given risks of dependence, falls, confusion, etc.

## 2017-10-29 NOTE — Assessment & Plan Note (Signed)
Reassurance given Advised to go avoid biting and picking at the area to allow it time to heal Discussed return precautions

## 2017-10-29 NOTE — Progress Notes (Signed)
Patient: Ian KempJohn Credeur, Male    DOB: 1980/07/10, 37 y.o.   MRN: [id number] Visit Date: 10/29/2017  Today's Provider: Shirlee LatchAngela Ana Woodroof, MD   Chief Complaint  Patient presents with  . New Patient (Initial Visit)   Subjective:    Annual physical exam Ian Harper is a 37 y.o. male who presents today for health maintenance and complete physical. He feels well. He reports exercising 3 days. He reports he is sleeping fairly well.  He has not had a PCP in a few years.  He has history of mitral valve prolapse.  This was diagnosed around age 37 when he started having palpitations.  He has been asymptomatic for several years.  He does not have any shortness of breath, chest pain, palpitations.  He is also concerned about a bump on the inside of his lower lip.  It occurred after he bit his lip about 2 months ago.  It seems to start to heal and burst and then gets worse again.  He picks at it and bites at it a lot without thinking about it.  Anxiety: He feels as though he has a generally anxious person.  He worries a lot about his health and his children.  About 2-3 times per week he will feel worried about a small thing and perseverate on it for several hours.  He does occasionally get some somatic symptoms with this.  He does not have any true panic attacks.  He feels generally stressed and irritable.  He states that he took Xanax in the past and used it as needed when he felt overwhelmed and this seemed to help.  He has not used any in more than 6 years.  He would like to have something to take as needed for this.  Allergic rhinitis: Patient has been tested and found to be allergic to cats in the past.  He has 3 cats at home.  He does use antihistamines as needed he was previously prescribed albuterol to use as needed, but never has had it filled.  Family history of breast cancer in mother at age 39 -----------------------------------------------------------------   Review of Systems    Constitutional: Negative.   HENT: Positive for mouth sores.   Eyes: Negative.   Respiratory: Negative.   Cardiovascular: Negative.   Gastrointestinal: Negative.   Endocrine: Negative.   Genitourinary: Negative.   Musculoskeletal: Negative.   Skin: Negative.   Allergic/Immunologic: Negative.   Neurological: Negative.   Hematological: Negative.   Psychiatric/Behavioral: Positive for agitation, decreased concentration, dysphoric mood and sleep disturbance.    Social History      He  reports that he has never smoked. He has never used smokeless tobacco. He reports that he drinks alcohol. He reports that he does not use drugs.       Social History   Socioeconomic History  . Marital status: Married    Spouse name: Not on file  . Number of children: Not on file  . Years of education: Not on file  . Highest education level: Not on file  Occupational History  . Not on file  Social Needs  . Financial resource strain: Not on file  . Food insecurity:    Worry: Not on file    Inability: Not on file  . Transportation needs:    Medical: Not on file    Non-medical: Not on file  Tobacco Use  . Smoking status: Never Smoker  . Smokeless tobacco: Never Used  Substance and  Sexual Activity  . Alcohol use: Yes  . Drug use: No  . Sexual activity: Not on file  Lifestyle  . Physical activity:    Days per week: Not on file    Minutes per session: Not on file  . Stress: Not on file  Relationships  . Social connections:    Talks on phone: Not on file    Gets together: Not on file    Attends religious service: Not on file    Active member of club or organization: Not on file    Attends meetings of clubs or organizations: Not on file    Relationship status: Not on file  Other Topics Concern  . Not on file  Social History Narrative  . Not on file    Past Medical History:  Diagnosis Date  . Allergy   . Anxiety   . Mitral valve prolapse      Patient Active Problem List    Diagnosis Date Noted  . Vaccine counseling 06/13/2015  . Constipation 05/10/2012  . GERD (gastroesophageal reflux disease) 03/24/2012  . Obesity, unspecified 03/24/2012    Past Surgical History:  Procedure Laterality Date  . APPENDECTOMY      Family History        Family Status  Relation Name Status  . Mother  Alive  . Father  Alive  . Sister  (Not Specified)  . MGM  (Not Specified)  . MGF  (Not Specified)        His family history includes Breast cancer in his mother; Cancer in his mother; Diabetes in his sister; Lung cancer in his maternal grandmother; Stroke in his father and maternal grandfather.      No Known Allergies   Current Outpatient Medications:  .  albuterol (PROAIR HFA) 108 (90 Base) MCG/ACT inhaler, Inhale 1-2 puffs into the lungs every 6 (six) hours as needed for wheezing or shortness of breath., Disp: 1 Inhaler, Rfl: 5   Patient Care Team: Erasmo Downer, MD as PCP - General (Family Medicine)      Objective:   Vitals: BP 120/80 (BP Location: Left Arm, Patient Position: Sitting, Cuff Size: Large)   Pulse 64   Temp 98.2 F (36.8 C) (Oral)   Resp 16   Ht 5\' 9"  (1.753 m)   Wt 202 lb (91.6 kg)   SpO2 99%   BMI 29.83 kg/m    Vitals:   10/29/17 0913  BP: 120/80  Pulse: 64  Resp: 16  Temp: 98.2 F (36.8 C)  TempSrc: Oral  SpO2: 99%  Weight: 202 lb (91.6 kg)  Height: 5\' 9"  (1.753 m)     Physical Exam  Constitutional: He is oriented to person, place, and time. He appears well-developed and well-nourished. No distress.  HENT:  Head: Normocephalic and atraumatic.  Right Ear: External ear normal.  Left Ear: External ear normal.  Nose: Nose normal.  Mouth/Throat: Uvula is midline and oropharynx is clear and moist.  MMM, +mucocele of inner lower lip  Eyes: Pupils are equal, round, and reactive to light. Conjunctivae and EOM are normal. No scleral icterus.  Neck: Neck supple. No thyromegaly present.  Cardiovascular: Normal rate, regular  rhythm, normal heart sounds and intact distal pulses.  No murmur heard. Pulmonary/Chest: Effort normal and breath sounds normal. No respiratory distress. He has no wheezes. He has no rales.  Abdominal: Soft. Bowel sounds are normal. He exhibits no distension. There is no tenderness. There is no rebound and no guarding.  Musculoskeletal: He  exhibits no edema or deformity.  Lymphadenopathy:    He has no cervical adenopathy.  Neurological: He is alert and oriented to person, place, and time.  Skin: Skin is warm and dry. Capillary refill takes less than 2 seconds. No rash noted.  Psychiatric: He has a normal mood and affect. His behavior is normal.  Vitals reviewed.    Depression Screen PHQ 2/9 Scores 10/29/2017  PHQ - 2 Score 0  PHQ- 9 Score 6    Assessment & Plan:     Routine Health Maintenance and Physical Exam  Exercise Activities and Dietary recommendations Goals   None     Immunization History  Administered Date(s) Administered  . Tdap 10/29/2009    Health Maintenance  Topic Date Due  . HIV Screening  05/16/1993  . INFLUENZA VACCINE  09/16/2017  . TETANUS/TDAP  10/30/2019     Discussed health benefits of physical activity, and encouraged him to engage in regular exercise appropriate for his age and condition.    --------------------------------------------------------------------  Problem List Items Addressed This Visit      Cardiovascular and Mediastinum   Mitral valve prolapse    Chronic and stable Asymptomatic No management or evaluation necessary currently If develops symptoms, would repeat echo        Respiratory   Allergic rhinitis due to animal hair and dander    Chronic and asymptomatic Can use OTC antihistamines and Flonase as needed Unable to avoid his allergen triggers        Digestive   Mucocele of lower lip    Reassurance given Advised to go avoid biting and picking at the area to allow it time to heal Discussed return precautions         Other   Generalized anxiety disorder    Chronic and stable It is affecting his day-to-day life We will start BuSpar 5 mg 3 times daily He can use this as needed, but discussed that it is more effective if taken regularly Discussed avoidance of chronic benzo therapy given risks of dependence, falls, confusion, etc.      Relevant Medications   busPIRone (BUSPAR) 5 MG tablet   Other Relevant Orders   Comprehensive metabolic panel   CBC   Family history of breast cancer    Discussed breast self exams      Family history of hyperlipidemia    Multiple family members with early strokes in their 86s related to high cholesterol We will screen annual lipid panel Discussed diet and exercise      Relevant Orders   Lipid panel   Comprehensive metabolic panel   Hyperglycemia    Reviewed previous labs from 2017 that showed fasting glucose of 107 Check hemoglobin A1c to ensure no prediabetes or diabetes      Relevant Orders   Hemoglobin A1c    Other Visit Diagnoses    Encounter for annual physical exam    -  Primary       Return in about 1 year (around 10/30/2018) for physical.   The entirety of the information documented in the History of Present Illness, Review of Systems and Physical Exam were personally obtained by me. Portions of this information were initially documented by Rondel Baton, CMA and reviewed by me for thoroughness and accuracy.    Erasmo Downer, MD, MPH [facility] 10/29/2017 11:03 AM

## 2017-10-29 NOTE — Assessment & Plan Note (Signed)
Discussed breast self exams

## 2017-10-29 NOTE — Assessment & Plan Note (Signed)
Reviewed previous labs from 2017 that showed fasting glucose of 107 Check hemoglobin A1c to ensure no prediabetes or diabetes

## 2017-10-29 NOTE — Assessment & Plan Note (Signed)
Chronic and stable Asymptomatic No management or evaluation necessary currently If develops symptoms, would repeat echo

## 2017-10-30 LAB — COMPREHENSIVE METABOLIC PANEL
A/G RATIO: 2.2 (ref 1.2–2.2)
ALBUMIN: 4.6 g/dL (ref 3.5–5.5)
ALK PHOS: 59 IU/L (ref 39–117)
ALT: 24 IU/L (ref 0–44)
AST: 24 IU/L (ref 0–40)
BUN / CREAT RATIO: 12 (ref 9–20)
BUN: 14 mg/dL (ref 6–20)
Bilirubin Total: 0.6 mg/dL (ref 0.0–1.2)
CO2: 25 mmol/L (ref 20–29)
CREATININE: 1.17 mg/dL (ref 0.76–1.27)
Calcium: 9.5 mg/dL (ref 8.7–10.2)
Chloride: 101 mmol/L (ref 96–106)
GFR calc Af Amer: 90 mL/min/{1.73_m2} (ref >59)
GFR, EST NON AFRICAN AMERICAN: 78 mL/min/{1.73_m2} (ref >59)
GLOBULIN, TOTAL: 2.1 g/dL (ref 1.5–4.5)
Glucose: 86 mg/dL (ref 65–99)
POTASSIUM: 4.1 mmol/L (ref 3.5–5.2)
SODIUM: 140 mmol/L (ref 134–144)
Total Protein: 6.7 g/dL (ref 6.0–8.5)

## 2017-10-30 LAB — CBC
Hematocrit: 45.8 % (ref 37.5–51.0)
Hemoglobin: 15.1 g/dL (ref 13.0–17.7)
MCH: 29.5 pg (ref 26.6–33.0)
MCHC: 33 g/dL (ref 31.5–35.7)
MCV: 90 fL (ref 79–97)
Platelets: 216 10*3/uL (ref 150–450)
RBC: 5.11 x10E6/uL (ref 4.14–5.80)
RDW: 13 % (ref 12.3–15.4)
WBC: 6 10*3/uL (ref 3.4–10.8)

## 2017-10-30 LAB — LIPID PANEL
CHOL/HDL RATIO: 3.7 ratio (ref 0.0–5.0)
Cholesterol, Total: 187 mg/dL (ref 100–199)
HDL: 51 mg/dL (ref >39)
LDL Calculated: 122 mg/dL — ABNORMAL HIGH (ref 0–99)
Triglycerides: 70 mg/dL (ref 0–149)
VLDL CHOLESTEROL CAL: 14 mg/dL (ref 5–40)

## 2017-10-30 LAB — HEMOGLOBIN A1C
Est. average glucose Bld gHb Est-mCnc: 103 mg/dL
HEMOGLOBIN A1C: 5.2 % (ref 4.8–5.6)

## 2017-11-19 ENCOUNTER — Ambulatory Visit: Payer: Self-pay | Admitting: Family Medicine

## 2018-05-10 ENCOUNTER — Telehealth: Payer: Self-pay | Admitting: Internal Medicine

## 2018-05-10 NOTE — Telephone Encounter (Signed)
Called and spoke to pt.  Pt reports of persistent dry cough, middle upper back discomfort and occ burning sensation in upper middle chest. Sx have been present since January. Denied fever, chills, sweats or additional resp sx.   Pt has been scheduled for phone visit 05/11/18, due to covid-19 concerns. Nothing further is needed.

## 2018-05-11 ENCOUNTER — Telehealth (INDEPENDENT_AMBULATORY_CARE_PROVIDER_SITE_OTHER): Payer: BLUE CROSS/BLUE SHIELD | Admitting: Internal Medicine

## 2018-05-11 ENCOUNTER — Other Ambulatory Visit: Payer: Self-pay

## 2018-05-11 DIAGNOSIS — R590 Localized enlarged lymph nodes: Secondary | ICD-10-CM

## 2018-05-11 DIAGNOSIS — R911 Solitary pulmonary nodule: Secondary | ICD-10-CM

## 2018-05-11 DIAGNOSIS — J4540 Moderate persistent asthma, uncomplicated: Secondary | ICD-10-CM

## 2018-05-11 DIAGNOSIS — J45909 Unspecified asthma, uncomplicated: Secondary | ICD-10-CM | POA: Diagnosis not present

## 2018-05-11 NOTE — Progress Notes (Signed)
[facility] [place] [facility] Consultation     Virtual Visit via Telephone Note I connected with pt on 05/11/18 at  9:15 AM EDT by telephone and verified that I am speaking with the correct person using two identifiers.   I discussed the limitations, risks, security and privacy concerns of performing an evaluation and management service by telephone and the availability of in person appointments. I also discussed with the patient that there may be a patient responsible charge related to this service. The patient expressed understanding and agreed to proceed. I discussed the assessment and treatment plan with the patient. The patient was provided an opportunity to ask questions and all were answered. The patient agreed with the plan and demonstrated an understanding of the instructions. Please see note below for further detail.    The patient was advised to call back or seek an in-person evaluation if the symptoms worsen or if the condition fails to improve as anticipated.  I provided 21 minutes of non-face-to-face time during this encounter.   Shane Crutch, MD     Assessment and Plan:  Hilar lymphadenopathy. -This is likely related to granulomatous disease, which may relate to previous infection or other conditions  Ace level, urine histo antigen negative.  -The patient's lymphadenopathy appears to have calcified, pointing to a likely benign cause.   Allergic rhinitis and allergic asthma with cough. -He has noted a slight improvement in the cough when he took antihistamine.  I suspect this is an allergic cough, likely related to allergic asthma, patient has 1 dog and 3 cats at home. -I asked him to start either Allegra or Zyrtec once daily.  He is asked to call us back in 1 month if he is not feeling better, and we can start him on a steroid inhaler.  Lung nodule. -Review of previous chest x-rays and CT films from 2002 and 2003 show evidence of nodule in the right middle  lobe that time, this appears to have progressed since that time. -Given 15 year history of these nodules, this likely represents a benign process, we'll continue to follow with yearly chest x-rays. - We will order chest x-ray in 6 months.  No orders of the defined types were placed in this encounter.    Follow up in 6 months after Chest Xray.    Date: 05/11/2018  MRN# [id number] Ian Harper [birth year]    Ian Harper is a 38 y.o. old male seen in consultation for chief complaint of:      HPI:  The patient is a 38 year old male with history of hilar lymphadenopathy and likely granulomatous disease.  He was last seen in May 2017, he was sent for a urine histo antigen and ACE level to look for any evidence of active processes, which were negative. Today he notes that he has been having a dry cough for about 3 months. Denies fevers or chills.  He has not had any recent chest x rays, he takes no inhaler and does not bring up anything. The cough is throughout the day, does not wake him sleep. He has a dog and 3 cats, one cat is new. None in bedroom.  He has tried taking cetirizine which helped somewhat.  He denies sinus drainage, reflux.    Today he notes that occasionally he has dyspnea, coughing, sneezing and wheezing, this seems to happen more when he is around his cat. He has never had allergy testing. This happens most often in the fall or early spring. When it  comes it lasts for a few days, usually at night.  He denies reflux, occasionally get sinus drainage. He has never taken a nasal spray. Cats are occasionally in bed.   He has never taken an antihistamine.   **ACE 08/05/15>>44 **Urine Histo Ag>>0   Review of chest x-ray film from 04/25/15: No other images are available for viewing. There is left calcified hilar lymphadenopathy, also seen on the right side, as well as a possible nodule in the right mid zone. -PPD test 07/05/15 results reviewed, there is 0 mm of induration. -Lab  testing including CBC, TSH drawn on 07/05/15, normal -Review of previous CT films were visualized: Scans from 06/11/2000, at that time, there appeared to be a 1.5-2 cm right middle lobe nodule did not notice any calcified lymph nodes in procedures at that time, there are no calcified lymph nodes within the liver, no calcified lymphadenopathy or nodules seen in addition, I see minimal mediastinal or hilar lymphadenopathy. Chest x-ray 2 view on 04/15/00. Hilar fullness, right middle lobe nodule seen Chest x-ray 2 view 06/13/01, mild progression from changes seen previously, early calcification seen in the left hilar area.  Review of Systems:  Constitutional: Feels well. Cardiovascular: No chest pain.  Pulmonary: Denies dyspnea.   The remainder of systems were reviewed and were found to be negative other than what is documented in the HPI.    Current Outpatient Medications:  .  busPIRone (BUSPAR) 5 MG tablet, Take 1 tablet (5 mg total) by mouth 3 (three) times daily., Disp: 90 tablet, Rfl: 1    Allergies:  Patient has no known allergies.   Other:  All other systems were reviewed with the patient and were negative other that what is mentioned in the HPI.       LABORATORY PANEL:   CBC No results for input(s): WBC, HGB, HCT, PLT in the last 168 hours. ------------------------------------------------------------------------------------------------------------------  Chemistries  No results for input(s): NA, K, CL, CO2, GLUCOSE, BUN, CREATININE, CALCIUM, MG, AST, ALT, ALKPHOS, BILITOT in the last 168 hours.  Invalid input(s): GFRCGP ------------------------------------------------------------------------------------------------------------------  Cardiac Enzymes No results for input(s): TROPONINI in the last 168 hours. ------------------------------------------------------------  RADIOLOGY:  No results found.     Thank  you for the consultation and for allowing [facility] [place]  [facility], [facility] Care to assist in the care of your patient. Our recommendations are noted above.  Please contact us if we can be of further service.  Wells Guiles, M.D., F.C.C.P.  Board Certified in Internal Medicine, Pulmonary Medicine, Critical Care Medicine, and Sleep Medicine.  [place] [facility] Office Number: [phone]  05/11/2018

## 2018-06-03 ENCOUNTER — Telehealth: Payer: Self-pay | Admitting: Internal Medicine

## 2018-06-03 NOTE — Telephone Encounter (Signed)
Called and spoke to pt.  Pt states he has been taking zyrtec as instructed during last visit with no relief in sx. Pt reports of burning sensation in his chest and dry cough.  Denied fever, chills or sweats.  Pt stated that DR mentioned starting an inhaler, if zyrtec did not work.  Pt aware that with it being later in the day, that it may be Monday before we receive a response. Pt voiced his understanding.   DR please advise. Thanks

## 2018-06-03 NOTE — Telephone Encounter (Signed)
lmtcb x1 for pt. 

## 2018-06-03 NOTE — Telephone Encounter (Signed)
Patient seen Dr Nicholos Johns, not Dr Marchelle Gearing Routing to [facility] triage pool

## 2018-06-03 NOTE — Telephone Encounter (Signed)
Patient is returning phone call.  Patient phone number is [phone].

## 2018-06-06 ENCOUNTER — Other Ambulatory Visit: Payer: Self-pay | Admitting: Internal Medicine

## 2018-06-06 MED ORDER — MOMETASONE FUROATE 220 MCG/INH IN AEPB: 2.0000 | INHALATION_SPRAY | Freq: Every day | RESPIRATORY_TRACT | 5 refills | Status: DC

## 2018-06-06 NOTE — Telephone Encounter (Signed)
Pt is aware of below message and voiced his understanding. Nothing further is needed.  

## 2018-06-06 NOTE — Telephone Encounter (Signed)
Sent script for asmanex.

## 2018-06-08 ENCOUNTER — Telehealth: Payer: Self-pay | Admitting: Internal Medicine

## 2018-06-08 NOTE — Telephone Encounter (Signed)
I called and spoke to patient. Advised him to call his insurance and see what is covered in the same class for perhaps a lower copay. Patient will call us and let us know what is covered.

## 2018-06-09 ENCOUNTER — Ambulatory Visit (INDEPENDENT_AMBULATORY_CARE_PROVIDER_SITE_OTHER): Payer: BLUE CROSS/BLUE SHIELD | Admitting: Family Medicine

## 2018-06-09 DIAGNOSIS — J4530 Mild persistent asthma, uncomplicated: Secondary | ICD-10-CM

## 2018-06-09 DIAGNOSIS — J45909 Unspecified asthma, uncomplicated: Secondary | ICD-10-CM | POA: Insufficient documentation

## 2018-06-09 MED ORDER — ALBUTEROL SULFATE HFA 108 (90 BASE) MCG/ACT IN AERS: 2.0000 | INHALATION_SPRAY | Freq: Four times a day (QID) | RESPIRATORY_TRACT | 0 refills | Status: DC | PRN

## 2018-06-09 MED ORDER — FLUTICASONE PROPIONATE HFA 110 MCG/ACT IN AERO: 1.0000 | INHALATION_SPRAY | Freq: Two times a day (BID) | RESPIRATORY_TRACT | 1 refills | Status: DC

## 2018-06-09 NOTE — Progress Notes (Signed)
Patient: Duffie Hampson Male    DOB: 1981-01-11   38 y.o.   MRN: [id number] Visit Date: 06/09/2018  Today's Provider: Shirlee Latch, MD   Chief Complaint  Patient presents with  . Cough   Subjective:      Virtual Visit via Video Note  I connected with Raynald Kemp on 06/09/18 at 10:40 AM EDT by a video enabled telemedicine application and verified that I am speaking with the correct person using two identifiers.   I discussed the limitations of evaluation and management by telemedicine and the availability of in person appointments. The patient expressed understanding and agreed to proceed.   Patient location: home Provider location: home office Persons involved in the visit: patient, provider    Cough  The current episode started more than 1 month ago. His past medical history is significant for asthma.  Symptoms started in mid January.  He describes it as a burning in his upper chest with a nonproductive cough.  Says it feels like he is clearing his throat with coughing.  He denies itchy/watery eyes, sneezing, rhinorrhea.  He had a telemedicine visit with pulmonology about 1 month ago who believes that this was related to allergies with some allergic asthma.  He does note that he is allergic to cats and he has tried to keep his animals outside, but has had no improvement.  He also tried Zyrtec with no improvement.  He called back to pulmonology and they prescribed him an inhaler, but he was unable to afford this due to cost.     No Known Allergies   Current Outpatient Medications:  .  albuterol (VENTOLIN HFA) 108 (90 Base) MCG/ACT inhaler, Inhale 2 puffs into the lungs every 6 (six) hours as needed for wheezing or shortness of breath., Disp: 1 Inhaler, Rfl: 0 .  busPIRone (BUSPAR) 5 MG tablet, Take 1 tablet (5 mg total) by mouth 3 (three) times daily., Disp: 90 tablet, Rfl: 1 .  fluticasone (FLOVENT HFA) 110 MCG/ACT inhaler, Inhale 1 puff into the lungs 2 (two) times  a day., Disp: 1 Inhaler, Rfl: 1  Review of Systems  Constitutional: Negative.   HENT: Negative.   Respiratory: Positive for cough.   Gastrointestinal: Negative.   Neurological: Negative.     Social History   Tobacco Use  . Smoking status: Never Smoker  . Smokeless tobacco: Never Used  Substance Use Topics  . Alcohol use: Yes    Alcohol/week: 3.0 standard drinks    Types: 3 Cans of beer per week      Objective:   There were no vitals taken for this visit. There were no vitals filed for this visit.   Physical Exam Constitutional:      Appearance: Normal appearance.  Pulmonary:     Effort: Pulmonary effort is normal. No respiratory distress.  Neurological:     Mental Status: He is alert and oriented to person, place, and time.  Psychiatric:        Mood and Affect: Mood normal.        Behavior: Behavior normal.         Assessment & Plan   Follow Up Instructions:    I discussed the assessment and treatment plan with the patient. The patient was provided an opportunity to ask questions and all were answered. The patient agreed with the plan and demonstrated an understanding of the instructions.   The patient was advised to call back or seek an in-person evaluation if the  symptoms worsen or if the condition fails to improve as anticipated.   Problem List Items Addressed This Visit      Respiratory   Allergic asthma - Primary    Patient's current symptoms of chest tightness and chronic cough seem most consistent with allergic asthma He is tried treatment for allergic rhinitis with an OTC antihistamine without relief He is also tried avoiding triggers without relief He does at this time warrant a controller medication for his asthma He was unable to afford Asmanex, so I will prescribe him Flovent Discussed using this daily regardless of symptoms to prevent symptoms May also use albuterol as needed Discussed return precautions      Relevant Medications    fluticasone (FLOVENT HFA) 110 MCG/ACT inhaler   albuterol (VENTOLIN HFA) 108 (90 Base) MCG/ACT inhaler       Return in about 3 months (around 09/08/2018) for Asthma control.   The entirety of the information documented in the History of Present Illness, Review of Systems and Physical Exam were personally obtained by me. Portions of this information were initially documented by [facility], CMA and reviewed by me for thoroughness and accuracy.    Erasmo DownerBacigalupo, Angela M, MD, MPH [facility] 06/09/2018 3:47 PM

## 2018-06-09 NOTE — Assessment & Plan Note (Signed)
Patient's current symptoms of chest tightness and chronic cough seem most consistent with allergic asthma He is tried treatment for allergic rhinitis with an OTC antihistamine without relief He is also tried avoiding triggers without relief He does at this time warrant a controller medication for his asthma He was unable to afford Asmanex, so I will prescribe him Flovent Discussed using this daily regardless of symptoms to prevent symptoms May also use albuterol as needed Discussed return precautions

## 2018-06-09 NOTE — Patient Instructions (Signed)
Asthma, Adult    Asthma is a long-term (chronic) condition that causes recurrent episodes in which the airways become tight and narrow. The airways are the passages that lead from the nose and mouth down into the lungs. Asthma episodes, also called asthma attacks, can cause coughing, wheezing, shortness of breath, and chest pain. The airways can also fill with mucus. During an attack, it can be difficult to breathe. Asthma attacks can range from minor to life threatening.  Asthma cannot be cured, but medicines and lifestyle changes can help control it and treat acute attacks.  What are the causes?  This condition is believed to be caused by inherited (genetic) and environmental factors, but its exact cause is not known.  There are many things that can bring on an asthma attack or make asthma symptoms worse (triggers). Asthma triggers are different for each person. Common triggers include:   Mold.   Dust.   Cigarette smoke.   Cockroaches.   Things that can cause allergy symptoms (allergens), such as animal dander or pollen from trees or grass.   Air pollutants such as household cleaners, wood smoke, smog, or chemical odors.   Cold air, weather changes, and winds (which increase molds and pollen in the air).   Strong emotional expressions such as crying or laughing hard.   Stress.   Certain medicines (such as aspirin) or types of medicines (such as beta-blockers).   Sulfites in foods and drinks. Foods and drinks that may contain sulfites include dried fruit, potato chips, and sparkling grape juice.   Infections or inflammatory conditions such as the flu, a cold, or inflammation of the nasal membranes (rhinitis).   Gastroesophageal reflux disease (GERD).   Exercise or strenuous activity.  What are the signs or symptoms?  Symptoms of this condition may occur right after asthma is triggered or many hours later. Symptoms include:   Wheezing. This can sound like whistling when you breathe.   Excessive  nighttime or early morning coughing.   Frequent or severe coughing with a common cold.   Chest tightness.   Shortness of breath.   Tiredness (fatigue) with minimal activity.  How is this diagnosed?  This condition is diagnosed based on:   Your medical history.   A physical exam.   Tests, which may include:  ? Lung function studies and pulmonary studies (spirometry). These tests can evaluate the flow of air in your lungs.  ? Allergy tests.  ? Imaging tests, such as X-rays.  How is this treated?  There is no cure for this condition, but treatment can help control your symptoms. Treatment for asthma usually involves:   Identifying and avoiding your asthma triggers.   Using medicines to control your symptoms. Generally, two types of medicines are used to treat asthma:  ? Controller medicines. These help prevent asthma symptoms from occurring. They are usually taken every day.  ? Fast-acting reliever or rescue medicines. These quickly relieve asthma symptoms by widening the narrow and tight airways. They are used as needed and provide short-term relief.   Using supplemental oxygen. This may be needed during a severe episode.   Using other medicines, such as:  ? Allergy medicines, such as antihistamines, if your asthma attacks are triggered by allergens.  ? Immune medicines (immunomodulators). These are medicines that help control the immune system.   Creating an asthma action plan. An asthma action plan is a written plan for managing and treating your asthma attacks. This plan includes:  ? A list   of your asthma triggers and how to avoid them.  ? Information about when medicines should be taken and when their dosage should be changed.  ? Instructions about using a device called a peak flow meter. A peak flow meter measures how well the lungs are working and the severity of your asthma. It helps you monitor your condition.  Follow these instructions at home:  Controlling your home environment  Control your home  environment in the following ways to help avoid triggers and prevent asthma attacks:   Change your heating and air conditioning filter regularly.   Limit your use of fireplaces and wood stoves.   Get rid of pests (such as roaches and mice) and their droppings.   Throw away plants if you see mold on them.   Clean floors and dust surfaces regularly. Use unscented cleaning products.   Try to have someone else vacuum for you regularly. Stay out of rooms while they are being vacuumed and for a short while afterward. If you vacuum, use a dust mask from a hardware store, a double-layered or microfilter vacuum cleaner bag, or a vacuum cleaner with a HEPA filter.   Replace carpet with wood, tile, or vinyl flooring. Carpet can trap dander and dust.   Use allergy-proof pillows, mattress covers, and box spring covers.   Keep your bedroom a trigger-free room.   Avoid pets and keep windows closed when allergens are in the air.   Wash beddings every week in hot water and dry them in a dryer.   Use blankets that are made of polyester or cotton.   Clean bathrooms and kitchens with bleach. If possible, have someone repaint the walls in these rooms with mold-resistant paint. Stay out of the rooms that are being cleaned and painted.   Wash your hands often with soap and water. If soap and water are not available, use hand sanitizer.   Do not allow anyone to smoke in your home.  General instructions   Take over-the-counter and prescription medicines only as told by your health care provider.  ? Speak with your health care provider if you have questions about how or when to take the medicines.  ? Make note if you are requiring more frequent dosages.   Do not use any products that contain nicotine or tobacco, such as cigarettes and e-cigarettes. If you need help quitting, ask your health care provider. Also, avoid being exposed to secondhand smoke.   Use a peak flow meter as told by your health care provider. Record and  keep track of the readings.   Understand and use the asthma action plan to help minimize, or stop an asthma attack, without needing to seek medical care.   Make sure you stay up to date on your yearly vaccinations as told by your health care provider. This may include vaccines for the flu and pneumonia.   Avoid outdoor activities when allergen counts are high and when air quality is low.   Wear a ski mask that covers your nose and mouth during outdoor winter activities. Exercise indoors on cold days if you can.   Warm up before exercising, and take time for a cool-down period after exercise.   Keep all follow-up visits as told by your health care provider. This is important.  Where to find more information   For information about asthma, turn to the Centers for Disease Control and Prevention at www.cdc.gov/asthma/faqs.htm   For air quality information, turn to AirNow at https://airnow.gov/  Contact   a health care provider if:   You have wheezing, shortness of breath, or a cough even while you are taking medicine to prevent attacks.   The mucus you cough up (sputum) is thicker than usual.   Your sputum changes from clear or white to yellow, green, gray, or bloody.   Your medicines are causing side effects, such as a rash, itching, swelling, or trouble breathing.   You need to use a reliever medicine more than 2-3 times a week.   Your peak flow reading is still at 50-79% of your personal best after following your action plan for 1 hour.   You have a fever.  Get help right away if:   You are getting worse and do not respond to treatment during an asthma attack.   You are short of breath when at rest or when doing very little physical activity.   You have difficulty eating, drinking, or talking.   You have chest pain or tightness.   You develop a fast heartbeat or palpitations.   You have a bluish color to your lips or fingernails.   You are light-headed or dizzy, or you faint.   Your peak flow  reading is less than 50% of your personal best.   You feel too tired to breathe normally.  Summary   Asthma is a long-term (chronic) condition that causes recurrent episodes in which the airways become tight and narrow. These episodes can cause coughing, wheezing, shortness of breath, and chest pain.   Asthma cannot be cured, but medicines and lifestyle changes can help control it and treat acute attacks.   Make sure you understand how to avoid triggers and how and when to use your medicines.   Asthma attacks can range from minor to life threatening. Get help right away if you have an asthma attack and do not respond to treatment with your usual rescue medicines.  This information is not intended to replace advice given to you by your health care provider. Make sure you discuss any questions you have with your health care provider.  Document Released: 02/02/2005 Document Revised: 03/09/2016 Document Reviewed: 03/09/2016  Elsevier Interactive Patient Education  2019 [employer]

## 2018-08-04 ENCOUNTER — Telehealth: Payer: Self-pay | Admitting: Internal Medicine

## 2018-08-04 NOTE — Telephone Encounter (Signed)
Called patient and made him aware of outpatient imaging center or here at [facility] for chest x-ray. No further questions at this time.

## 2018-08-05 ENCOUNTER — Other Ambulatory Visit: Payer: Self-pay

## 2018-08-05 ENCOUNTER — Ambulatory Visit
Admission: RE | Admit: 2018-08-05 | Discharge: 2018-08-05 | Disposition: A | Discharge status: Home / Self Care | Payer: BLUE CROSS/BLUE SHIELD | Source: Ambulatory Visit | Attending: Internal Medicine | Admitting: Internal Medicine

## 2018-08-05 DIAGNOSIS — J4540 Moderate persistent asthma, uncomplicated: Secondary | ICD-10-CM

## 2018-08-15 ENCOUNTER — Telehealth: Payer: Self-pay | Admitting: Internal Medicine

## 2018-08-15 NOTE — Progress Notes (Signed)
[facility] Consultation      Assessment and Plan:  Hilar lymphadenopathy. -This is likely related to granulomatous disease, which may relate to previous infection or other conditions  Ace level, urine histo antigen negative.  -The patient's lymphadenopathy appears to have calcified, pointing to a likely benign cause.   Allergic rhinitis and allergic asthma with cough. -He has noted a slight improvement in the cough when he took antihistamine.  I suspect this is an allergic cough, likely related to allergic asthma, patient has 1 dog and 3 cats at home. -Start asmanex inhaler and albuterol as needed.  -Advised to use steroid nasal spray.  Lung nodule. -Review of previous chest x-rays and CT films from 2002 and 2003 show evidence of nodule in the right middle lobe that time, this appears to have progressed since that time. -Given 15 year history of these nodules, this likely represents a benign process.  Chest x-ray in June 2020 shows no significant change from previous 3 years prior, no further surveillance imaging would be needed.    Return in about 6 months (around 02/15/2019).    Date: 08/15/2018  MRN# [id number] Ian Harper Jun 30, 1980    Ian Harper is a 38 y.o. old male seen in consultation for chief complaint of:      HPI:  Ian Harper is a 38 y.o. male  with history of hilar lymphadenopathy and likely granulomatous disease.  He was last seen in in the summer 2019, he was having an allergic cough, asked to use an antihistamine, started on Asmanex in addition to Ventolin.  He has been having a dry cough for about 3 months, has a dog and 3 cats, none in bedroom.  Denies sinus drainage and reflux.  Since his last visit he had not yet started on the Asmanex and albuterol due to cost.  However he has since that time he finds that he can now afford them and he is going to start them.  He feels that the allergies are worse and he has some nasal drainage and  stuffiness. He continues to have cough.   He has never taken an antihistamine.   **Chest x-ray 08/05/2018>> imaging personally reviewed, unchanged left hilar calcifications and right mid zone calcified nodule, in comparison with previous on 08/05/2015. **ACE 08/05/15>>44 **Urine Histo Ag>>0   Review of chest x-ray film from 04/25/15: No other images are available for viewing. There is left calcified hilar lymphadenopathy, also seen on the right side, as well as a possible nodule in the right mid zone. -PPD test 07/05/15 results reviewed, there is 0 mm of induration. -Lab testing including CBC, TSH drawn on 07/05/15, normal -Review of previous CT films were visualized: Scans from 06/11/2000, at that time, there appeared to be a 1.5-2 cm right middle lobe nodule did not notice any calcified lymph nodes in procedures at that time, there are no calcified lymph nodes within the liver, no calcified lymphadenopathy or nodules seen in addition, I see minimal mediastinal or hilar lymphadenopathy. Chest x-ray 2 view on 04/15/00. Hilar fullness, right middle lobe nodule seen Chest x-ray 2 view 06/13/01, mild progression from changes seen previously, early calcification seen in the left hilar area.     Current Outpatient Medications:    albuterol (VENTOLIN HFA) 108 (90 Base) MCG/ACT inhaler, Inhale 2 puffs into the lungs every 6 (six) hours as needed for wheezing or shortness of breath., Disp: 1 Inhaler, Rfl: 0   busPIRone (BUSPAR) 5 MG tablet, Take 1 tablet (5  mg total) by mouth 3 (three) times daily., Disp: 90 tablet, Rfl: 1   fluticasone (FLOVENT HFA) 110 MCG/ACT inhaler, Inhale 1 puff into the lungs 2 (two) times a day., Disp: 1 Inhaler, Rfl: 1    Allergies:  Patient has no known allergies.  Review of Systems:  Constitutional: Feels well. Cardiovascular: Denies chest pain, exertional chest pain.  Pulmonary: Denies hemoptysis, pleuritic chest pain.   The remainder of systems were reviewed and were  found to be negative other than what is documented in the HPI.    Physical Examination:   VS: BP 118/78 (BP Location: Right Leg, Cuff Size: Normal)    Pulse 67    Temp 97.9 F (36.6 C) (Skin)    Ht 5\' 9"  (1.753 m)    Wt 212 lb (96.2 kg)    SpO2 97%    BMI 31.31 kg/m   General Appearance: No distress  Neuro:without focal findings, mental status, speech normal, alert and oriented HEENT: PERRLA, EOM intact Pulmonary: No wheezing, No rales  CardiovascularNormal S1,S2.  No m/r/g.  Abdomen: Benign, Soft, non-tender, No masses Renal:  No costovertebral tenderness  GU:  No performed at this time. Endoc: No evident thyromegaly, no signs of acromegaly or Cushing features Skin:   warm, no rashes, no ecchymosis  Extremities: normal, no cyanosis, clubbing.      LABORATORY PANEL:   CBC No results for input(s): WBC, HGB, HCT, PLT in the last 168 hours. ------------------------------------------------------------------------------------------------------------------  Chemistries  No results for input(s): NA, K, CL, CO2, GLUCOSE, BUN, CREATININE, CALCIUM, MG, AST, ALT, ALKPHOS, BILITOT in the last 168 hours.  Invalid input(s): GFRCGP ------------------------------------------------------------------------------------------------------------------  Cardiac Enzymes No results for input(s): TROPONINI in the last 168 hours. ------------------------------------------------------------  RADIOLOGY:  No results found.     Thank  you for the consultation and for allowing [facility] to assist in the care of your patient. Our recommendations are noted above.  Please contact us if we can be of further service.  Wells Guileseep Amman Bartel, M.D., F.C.C.P.  Board Certified in Internal Medicine, Pulmonary Medicine, Critical Care Medicine, and Sleep Medicine.  [facility] Office Number: [phone]  08/15/2018

## 2018-08-15 NOTE — Telephone Encounter (Signed)
Called patient for COVID-19 pre-screening for in office visit. ° °Have you recently traveled any where out of the local area in the last 2 weeks? No ° °Have you been in close contact with a person diagnosed with COVID-19 or someone awaiting results within the last 2 weeks? No ° °Do you currently have any of the following symptoms? If so, when did they start? °Cough     Diarrhea   Joint Pain °Fever      Muscle Pain   Red eyes °Shortness of breath   Abdominal pain  Vomiting °Loss of smell    Rash    Sore Throat °Headache    Weakness   Bruising or bleeding ° ° °Okay to proceed with visit 08/16/2018 °  ° ° °

## 2018-08-16 ENCOUNTER — Other Ambulatory Visit: Payer: Self-pay

## 2018-08-16 ENCOUNTER — Encounter: Payer: Self-pay | Admitting: Internal Medicine

## 2018-08-16 ENCOUNTER — Ambulatory Visit: Payer: BLUE CROSS/BLUE SHIELD | Admitting: Internal Medicine

## 2018-08-16 VITALS — BP 118/78 | HR 67 | Temp 97.9°F | Ht 69.0 in | Wt 212.0 lb

## 2018-08-16 DIAGNOSIS — J4540 Moderate persistent asthma, uncomplicated: Secondary | ICD-10-CM

## 2018-08-16 NOTE — Patient Instructions (Addendum)
Start previously prescribed inhalers.  Can use nasal spray such as nasacort or flonase, 2 sprays in each nostril once daily.

## 2018-09-08 ENCOUNTER — Ambulatory Visit: Payer: Self-pay | Admitting: Family Medicine

## 2018-10-31 ENCOUNTER — Encounter: Payer: Self-pay | Admitting: Family Medicine

## 2018-12-05 ENCOUNTER — Other Ambulatory Visit: Payer: Self-pay

## 2018-12-05 ENCOUNTER — Encounter: Payer: Self-pay | Admitting: Family Medicine

## 2018-12-05 ENCOUNTER — Ambulatory Visit (INDEPENDENT_AMBULATORY_CARE_PROVIDER_SITE_OTHER): Payer: BLUE CROSS/BLUE SHIELD | Admitting: Family Medicine

## 2018-12-05 VITALS — BP 129/82 | HR 69 | Temp 97.1°F | Wt 205.0 lb

## 2018-12-05 DIAGNOSIS — R300 Dysuria: Secondary | ICD-10-CM

## 2018-12-05 DIAGNOSIS — K6289 Other specified diseases of anus and rectum: Secondary | ICD-10-CM

## 2018-12-05 DIAGNOSIS — N4889 Other specified disorders of penis: Secondary | ICD-10-CM | POA: Diagnosis not present

## 2018-12-05 LAB — POCT URINALYSIS DIPSTICK
Bilirubin, UA: NEGATIVE
Blood, UA: NEGATIVE
Glucose, UA: NEGATIVE
Ketones, UA: NEGATIVE
Leukocytes, UA: NEGATIVE
Nitrite, UA: NEGATIVE
Protein, UA: NEGATIVE
Spec Grav, UA: 1.01 (ref 1.010–1.025)
Urobilinogen, UA: 0.2 E.U./dL
pH, UA: 6 (ref 5.0–8.0)

## 2018-12-05 MED ORDER — SULFAMETHOXAZOLE-TRIMETHOPRIM 800-160 MG PO TABS: 1.0000 | ORAL_TABLET | Freq: Two times a day (BID) | ORAL | 0 refills | Status: DC

## 2018-12-05 NOTE — Patient Instructions (Signed)

## 2018-12-05 NOTE — Progress Notes (Signed)
Patient: Ian Harper Male    DOB: 1980-06-06   38 y.o.   MRN: [id number] Visit Date: 12/05/2018  Today's Provider: Shirlee Latch, MD   Chief Complaint  Patient presents with   Rectal Pain    Started 9 days ago.   Subjective:     HPI   Patient reports total, constant ache deep from his rectum.  He also has a burning sensation at the tip of his penis and a sharp pain in his rectum at the end of urination.  Nothing else seems to make it worse.  This is been ongoing for 9 days without any recent worsening.  He states he also has a pressure when lying flat.  He has not tried any medications.  He has not noticed anything that makes it better.  States he had something similar previously, but it self resolved and he did not have it evaluated.  He is sexually active with 1 male partner in the last 6 months.  He is on monogamous relationship and denies risk factors for STDs.  He has tried to push fluids as he noted he was only urinating about every 8 hours with very little coming out.  He is experiencing some dribbling.  He denies hematuria, back pain, fever.    No Known Allergies   Current Outpatient Medications:    busPIRone (BUSPAR) 5 MG tablet, Take 1 tablet (5 mg total) by mouth 3 (three) times daily., Disp: 90 tablet, Rfl: 1   albuterol (VENTOLIN HFA) 108 (90 Base) MCG/ACT inhaler, Inhale 2 puffs into the lungs every 6 (six) hours as needed for wheezing or shortness of breath. (Patient not taking: Reported on 08/16/2018), Disp: 1 Inhaler, Rfl: 0   fluticasone (FLOVENT HFA) 110 MCG/ACT inhaler, Inhale 1 puff into the lungs 2 (two) times a day. (Patient not taking: Reported on 08/16/2018), Disp: 1 Inhaler, Rfl: 1   sulfamethoxazole-trimethoprim (BACTRIM DS) 800-160 MG tablet, Take 1 tablet by mouth 2 (two) times daily for 7 days., Disp: 14 tablet, Rfl: 0  Review of Systems  Constitutional: Negative.   Gastrointestinal: Positive for constipation and rectal pain.  Negative for abdominal distention, abdominal pain, anal bleeding, blood in stool, diarrhea, nausea and vomiting.  Genitourinary: Positive for decreased urine volume, dysuria and penile pain. Negative for difficulty urinating, discharge, enuresis, flank pain, frequency, genital sores, hematuria, penile swelling, scrotal swelling, testicular pain and urgency.    Social History   Tobacco Use   Smoking status: Never Smoker   Smokeless tobacco: Never Used  Substance Use Topics   Alcohol use: Yes    Alcohol/week: 3.0 standard drinks    Types: 3 Cans of beer per week      Objective:   BP 129/82 (BP Location: Right Arm, Patient Position: Sitting, Cuff Size: Normal)    Pulse 69    Temp (!) 97.1 F (36.2 C) (Temporal)    Wt 205 lb (93 kg)    BMI 30.27 kg/m  Vitals:   12/05/18 1435  BP: 129/82  Pulse: 69  Temp: (!) 97.1 F (36.2 C)  TempSrc: Temporal  Weight: 205 lb (93 kg)  Body mass index is 30.27 kg/m.   Physical Exam Vitals signs reviewed.  Constitutional:      General: He is not in acute distress.    Appearance: Normal appearance.  HENT:     Head: Normocephalic and atraumatic.  Eyes:     Conjunctiva/sclera: Conjunctivae normal.  Cardiovascular:     Rate and  Rhythm: Normal rate and regular rhythm.     Pulses: Normal pulses.     Heart sounds: No murmur.  Pulmonary:     Effort: Pulmonary effort is normal. No respiratory distress.     Breath sounds: Normal breath sounds. No wheezing.  Abdominal:     General: Bowel sounds are normal. There is no distension.     Palpations: Abdomen is soft.     Tenderness: There is no abdominal tenderness. There is no right CVA tenderness, left CVA tenderness, guarding or rebound.  Genitourinary:    Prostate: Not enlarged, not tender and no nodules present.     Rectum: No mass, tenderness, anal fissure, external hemorrhoid or internal hemorrhoid.  Skin:    General: Skin is warm and dry.     Findings: No rash.  Neurological:      Mental Status: He is alert and oriented to person, place, and time. Mental status is at baseline.  Psychiatric:        Mood and Affect: Mood is anxious.      Results for orders placed or performed in visit on 12/05/18  POCT urinalysis dipstick  Result Value Ref Range   Color, UA     Clarity, UA     Glucose, UA Negative Negative   Bilirubin, UA Negative    Ketones, UA Negative    Spec Grav, UA 1.010 1.010 - 1.025   Blood, UA Negative    pH, UA 6.0 5.0 - 8.0   Protein, UA Negative Negative   Urobilinogen, UA 0.2 0.2 or 1.0 E.U./dL   Nitrite, UA Negative    Leukocytes, UA Negative Negative   Appearance     Odor         Assessment & Plan   1. Dysuria 2. Rectal pain 3. Penile pain -New problem -Description of symptoms concerning for possible prostatitis, but patient is well-appearing and not particularly tender on prostate exam -Rectal exam is also benign -We discussed STDs as possible cause, but patient denies any possibility of STD -We will start empiric treatment for possible UTI with 7-day course of Bactrim -Send urine culture to confirm and check sensitivities -Check CBC and CRP -If not improving with antibiotics, consider urology referral -Discussed return precautions  - CBC - C-reactive protein - Urine Culture   Meds ordered this encounter  Medications   sulfamethoxazole-trimethoprim (BACTRIM DS) 800-160 MG tablet    Sig: Take 1 tablet by mouth 2 (two) times daily for 7 days.    Dispense:  14 tablet    Refill:  0     Return if symptoms worsen or fail to improve.   The entirety of the information documented in the History of Present Illness, Review of Systems and Physical Exam were personally obtained by me. Portions of this information were initially documented by Ashley Royalty, CMA and reviewed by me for thoroughness and accuracy.    Admir Candelas, Dionne Bucy, MD MPH [facility]

## 2018-12-06 ENCOUNTER — Telehealth: Payer: Self-pay

## 2018-12-06 LAB — CBC
Hematocrit: 47 % (ref 37.5–51.0)
Hemoglobin: 15.7 g/dL (ref 13.0–17.7)
MCH: 29.7 pg (ref 26.6–33.0)
MCHC: 33.4 g/dL (ref 31.5–35.7)
MCV: 89 fL (ref 79–97)
Platelets: 244 10*3/uL (ref 150–450)
RBC: 5.29 x10E6/uL (ref 4.14–5.80)
RDW: 13 % (ref 11.6–15.4)
WBC: 6.7 10*3/uL (ref 3.4–10.8)

## 2018-12-06 LAB — C-REACTIVE PROTEIN: CRP: <1 mg/L (ref 0–10)

## 2018-12-06 NOTE — Telephone Encounter (Signed)
Pt advised.   Thanks,   -Shayne Diguglielmo  

## 2018-12-06 NOTE — Telephone Encounter (Signed)
-----   Message from Virginia Crews, MD sent at 12/06/2018  8:01 AM EDT ----- Normal labs

## 2018-12-07 LAB — URINE CULTURE: Organism ID, Bacteria: NO GROWTH

## 2018-12-12 ENCOUNTER — Other Ambulatory Visit: Payer: Self-pay

## 2018-12-12 ENCOUNTER — Other Ambulatory Visit (HOSPITAL_COMMUNITY): Admission: RE | Admit: 2018-12-12 | Payer: BLUE CROSS/BLUE SHIELD | Source: Ambulatory Visit

## 2018-12-12 ENCOUNTER — Ambulatory Visit (INDEPENDENT_AMBULATORY_CARE_PROVIDER_SITE_OTHER): Payer: BLUE CROSS/BLUE SHIELD | Admitting: Family Medicine

## 2018-12-12 ENCOUNTER — Encounter: Payer: Self-pay | Admitting: Family Medicine

## 2018-12-12 VITALS — BP 134/81 | HR 87 | Temp 97.3°F | Wt 206.4 lb

## 2018-12-12 DIAGNOSIS — Z113 Encounter for screening for infections with a predominantly sexual mode of transmission: Secondary | ICD-10-CM

## 2018-12-12 DIAGNOSIS — Z0000 Encounter for general adult medical examination without abnormal findings: Secondary | ICD-10-CM | POA: Diagnosis not present

## 2018-12-12 DIAGNOSIS — R300 Dysuria: Secondary | ICD-10-CM

## 2018-12-12 DIAGNOSIS — Z6830 Body mass index (BMI) 30.0-30.9, adult: Secondary | ICD-10-CM

## 2018-12-12 DIAGNOSIS — K6289 Other specified diseases of anus and rectum: Secondary | ICD-10-CM

## 2018-12-12 DIAGNOSIS — E669 Obesity, unspecified: Secondary | ICD-10-CM

## 2018-12-12 MED ORDER — CIPROFLOXACIN HCL 500 MG PO TABS: 500.0000 mg | ORAL_TABLET | Freq: Two times a day (BID) | ORAL | 0 refills | Status: DC

## 2018-12-12 NOTE — Progress Notes (Deleted)
m °

## 2018-12-12 NOTE — Assessment & Plan Note (Signed)
Patient was seen last week for this problem He was treated presumptively for UTI, but urine culture returned with no growth States he was improving with Bactrim, but after getting side effects, he stopped this prematurely Of note, patient's wife was treated for malodorous vaginal discharge and found to have a retained tampon on exam recently (unclear if he knows about the tampon, and I did not discuss this with him due to patient privacy, but this may be important for urology to know) I also wonder if he could have gotten some sort of bacterial infection from his contact with the retained tampon as they had had sexual intercourse while it was there per the patient's wife's report Despite negative culture, his symptoms do seem consistent with possible UTI/prostatitis (though his prostatitis would be mild as he does not have any fevers and he appears overall well), so we will treat with 1 week course of Cipro We will also refer to urology for further evaluation Discussed return precautions 

## 2018-12-12 NOTE — Progress Notes (Signed)
Patient: Ian Harper, Male    DOB: 07-Sep-1980, 38 y.o.   MRN: [id number] Visit Date: 12/12/2018  Today's Provider: Shirlee Latch, MD   Chief Complaint  Patient presents with  . Annual Exam   Subjective:  I, Ian Harper CMA, am acting as a Neurosurgeon for Ian Latch, MD.    Annual physical exam Ian Harper is a 38 y.o. male who presents today for health maintenance and complete physical. He feels poorly, due to having rectal pain and burning during urination per pt. He reports exercising includes none. He reports he is sleeping fairly well.  Seemed like the antibiotic was helping with the burning  But gave dry mouth and dry eyes, so he stopped it after only a few doses.  Symptoms are about the same as they were when he was evaluated last week.  He denies any fevers, abdominal pain, penile discharge, difficulty urinating, hematuria.  Of note, patient's wife was also recently seen for vaginal discharge and odor about 3 to 4 weeks ago.  He states that it was "the smell of death".  He states that she has been doing better without any problems, but he wonders if he could have gotten something from that.  -----------------------------------------------------------------   Review of Systems  Constitutional: Negative.   HENT: Negative.   Eyes: Negative.   Respiratory: Negative.   Cardiovascular: Negative.   Gastrointestinal: Positive for constipation and rectal pain.  Endocrine: Positive for polydipsia.  Genitourinary: Negative.   Musculoskeletal: Negative.   Skin: Negative.   Allergic/Immunologic: Negative.   Neurological: Negative.   Hematological: Negative.   Psychiatric/Behavioral: Negative.     Social History He  reports that he has never smoked. He has never used smokeless tobacco. He reports current alcohol use of about 3.0 standard drinks of alcohol per week. He reports that he does not use drugs. Social History   Socioeconomic History  . Marital  status: Married    Spouse name: Not on file  . Number of children: 3  . Years of education: Not on file  . Highest education level: Not on file  Occupational History  . Occupation: [employer]    Comment: looking for teaching job  Social Needs  . Financial resource strain: Not on file  . Food insecurity    Worry: Not on file    Inability: Not on file  . Transportation needs    Medical: Not on file    Non-medical: Not on file  Tobacco Use  . Smoking status: Never Smoker  . Smokeless tobacco: Never Used  Substance and Sexual Activity  . Alcohol use: Yes    Alcohol/week: 3.0 standard drinks    Types: 3 Cans of beer per week  . Drug use: No  . Sexual activity: Yes    Partners: Female  Lifestyle  . Physical activity    Days per week: Not on file    Minutes per session: Not on file  . Stress: Not on file  Relationships  . Social Musician on phone: Not on file    Gets together: Not on file    Attends religious service: Not on file    Active member of club or organization: Not on file    Attends meetings of clubs or organizations: Not on file    Relationship status: Not on file  Other Topics Concern  . Not on file  Social History Narrative  . Not on file    Patient Active  Problem List   Diagnosis Date Noted  . Allergic asthma 06/09/2018  . Mitral valve prolapse 10/29/2017  . Generalized anxiety disorder 10/29/2017  . Allergic rhinitis due to animal hair and dander 10/29/2017  . 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 Family history of breast cancer 10/29/2017  . Family history of hyperlipidemia 10/29/2017  . Hyperglycemia 10/29/2017    Past Surgical History:  Procedure Laterality Date  . APPENDECTOMY      Family History  Family Status  Relation Name Status  . Mother  Alive  . Father  Alive  . Sister  (Not Specified)  . MGM  (Not Specified)  . MGF  (Not Specified)  . PGM  (Not Specified)  . Mat Uncle  (Not Specified)   His family history includes  Breast cancer (age of onset: 72) in his mother; Diabetes in his maternal grandfather, maternal uncle, and sister; Hyperlipidemia in his father and paternal grandmother; Lung cancer in his maternal grandmother; Stroke in his maternal grandfather and paternal grandmother; Stroke (age of onset: 26) in his father.     No Known Allergies  Previous Medications   ALBUTEROL (VENTOLIN HFA) 108 (90 BASE) MCG/ACT INHALER    Inhale 2 puffs into the lungs every 6 (six) hours as needed for wheezing or shortness of breath.   BUSPIRONE (BUSPAR) 5 MG TABLET    Take 1 tablet (5 mg total) by mouth 3 (three) times daily.   FLUTICASONE (FLOVENT HFA) 110 MCG/ACT INHALER    Inhale 1 puff into the lungs 2 (two) times a day.   SULFAMETHOXAZOLE-TRIMETHOPRIM (BACTRIM DS) 800-160 MG TABLET    Take 1 tablet by mouth 2 (two) times daily for 7 days.    Patient Care Team: Virginia Crews, MD as PCP - General (Family Medicine)      Objective:   Vitals: BP 134/81 (BP Location: Left Arm, Patient Position: Sitting, Cuff Size: Normal)   Pulse 87   Temp (!) 97.3 F (36.3 C) (Temporal)   Wt 206 lb 6.4 oz (93.6 kg)   BMI 30.48 kg/m    Physical Exam Vitals signs reviewed.  Constitutional:      General: He is not in acute distress.    Appearance: Normal appearance. He is well-developed. He is not diaphoretic.  HENT:     Head: Normocephalic and atraumatic.     Right Ear: Tympanic membrane, ear canal and external ear normal.     Left Ear: Tympanic membrane, ear canal and external ear normal.  Eyes:     General: No scleral icterus.    Conjunctiva/sclera: Conjunctivae normal.     Pupils: Pupils are equal, round, and reactive to light.  Neck:     Musculoskeletal: Neck supple.     Thyroid: No thyromegaly.  Cardiovascular:     Rate and Rhythm: Normal rate and regular rhythm.     Pulses: Normal pulses.     Heart sounds: Normal heart sounds. No murmur.  Pulmonary:     Effort: Pulmonary effort is normal. No  respiratory distress.     Breath sounds: Normal breath sounds. No wheezing or rales.  Abdominal:     General: There is no distension.     Palpations: Abdomen is soft.     Tenderness: There is no abdominal tenderness. There is no guarding or rebound.  Musculoskeletal:        General: No deformity.     Right lower leg: No edema.     Left lower leg: No edema.  Lymphadenopathy:  Cervical: No cervical adenopathy.  Skin:    General: Skin is warm and dry.     Capillary Refill: Capillary refill takes less than 2 seconds.     Findings: No rash.  Neurological:     Mental Status: He is alert and oriented to person, place, and time. Mental status is at baseline.  Psychiatric:        Mood and Affect: Mood normal.        Behavior: Behavior normal.        Thought Content: Thought content normal.     Depression Screen PHQ 2/9 Scores 12/12/2018 07/07/2018 10/29/2017  PHQ - 2 Score 3 4 0  PHQ- 9 Score 4 7 6       Assessment & Plan:     Routine Health Maintenance and Physical Exam  Exercise Activities and Dietary recommendations Goals   None     Immunization History  Administered Date(s) Administered  . Tdap 10/29/2009    Health Maintenance  Topic Date Due  . HIV Screening  05/16/1993  . INFLUENZA VACCINE  09/17/2018  . TETANUS/TDAP  10/30/2019     Discussed health benefits of physical activity, and encouraged him to engage in regular exercise appropriate for his age and condition.    --------------------------------------------------------------------  Problem List Items Addressed This Visit      Other   Class 1 obesity without serious comorbidity with body mass index (BMI) of 30.0 to 30.9 in adult    Discussed importance of healthy weight management Discussed diet and exercise      Relevant Orders   Comprehensive metabolic panel   Lipid panel   Dysuria    Patient was seen last week for this problem He was treated presumptively for UTI, but urine culture  returned with no growth States he was improving with Bactrim, but after getting side effects, he stopped this prematurely Of note, patient's wife was treated for malodorous vaginal discharge and found to have a retained tampon on exam recently (unclear if he knows about the tampon, and I did not discuss this with him due to patient privacy, but this may be important for urology to know) I also wonder if he could have gotten some sort of bacterial infection from his contact with the retained tampon as they had had sexual intercourse while it was there per the patient's wife's report Despite negative culture, his symptoms do seem consistent with possible UTI/prostatitis (though his prostatitis would be mild as he does not have any fevers and he appears overall well), so we will treat with 1 week course of Cipro STD testing also performed today We will also refer to urology for further evaluation Discussed return precautions      Relevant Orders   Ambulatory referral to Urology   Rectal pain    Patient was seen last week for this problem He was treated presumptively for UTI, but urine culture returned with no growth States he was improving with Bactrim, but after getting side effects, he stopped this prematurely Of note, patient's wife was treated for malodorous vaginal discharge and found to have a retained tampon on exam recently (unclear if he knows about the tampon, and I did not discuss this with him due to patient privacy, but this may be important for urology to know) I also wonder if he could have gotten some sort of bacterial infection from his contact with the retained tampon as they had had sexual intercourse while it was there per the patient's wife's report Despite  negative culture, his symptoms do seem consistent with possible UTI/prostatitis (though his prostatitis would be mild as he does not have any fevers and he appears overall well), so we will treat with 1 week course of Cipro  STD testing also performed today We will also refer to urology for further evaluation Discussed return precautions      Relevant Orders   Ambulatory referral to Urology    Other Visit Diagnoses    Encounter for annual physical exam    -  Primary   Relevant Orders   Comprehensive metabolic panel   Lipid panel   HIV antibody (with reflex)   RPR   Screen for STD (sexually transmitted disease)       Relevant Orders   HIV antibody (with reflex)   RPR   Urine cytology ancillary only       Return in about 1 year (around 12/12/2019) for CPE.   The entirety of the information documented in the History of Present Illness, Review of Systems and Physical Exam were personally obtained by me. Portions of this information were initially documented by [facility], CMA and reviewed by me for thoroughness and accuracy.    Darlyne Schmiesing, Marzella SchleinAngela M, MD MPH [facility]

## 2018-12-12 NOTE — Assessment & Plan Note (Signed)
Discussed importance of healthy weight management Discussed diet and exercise  

## 2018-12-12 NOTE — Patient Instructions (Signed)
Preventive Care 40-40 Years Old, Male Preventive care refers to lifestyle choices and visits with your health care provider that can promote health and wellness. This includes:  A yearly physical exam. This is also called an annual well check.  Regular dental and eye exams.  Immunizations.  Screening for certain conditions.  Healthy lifestyle choices, such as eating a healthy diet, getting regular exercise, not using drugs or products that contain nicotine and tobacco, and limiting alcohol use. What can I expect for my preventive care visit? Physical exam Your health care provider will check:  Height and weight. These may be used to calculate body mass index (BMI), which is a measurement that tells if you are at a healthy weight.  Heart rate and blood pressure.  Your skin for abnormal spots. Counseling Your health care provider may ask you questions about:  Alcohol, tobacco, and drug use.  Emotional well-being.  Home and relationship well-being.  Sexual activity.  Eating habits.  Work and work environment. What immunizations do I need?  Influenza (flu) vaccine  This is recommended every year. Tetanus, diphtheria, and pertussis (Tdap) vaccine  You may need a Td booster every 10 years. Varicella (chickenpox) vaccine  You may need this vaccine if you have not already been vaccinated. Zoster (shingles) vaccine  You may need this after age 60. Measles, mumps, and rubella (MMR) vaccine  You may need at least one dose of MMR if you were born in 1957 or later. You may also need a second dose. Pneumococcal conjugate (PCV13) vaccine  You may need this if you have certain conditions and were not previously vaccinated. Pneumococcal polysaccharide (PPSV23) vaccine  You may need one or two doses if you smoke cigarettes or if you have certain conditions. Meningococcal conjugate (MenACWY) vaccine  You may need this if you have certain conditions. Hepatitis A vaccine   You may need this if you have certain conditions or if you travel or work in places where you may be exposed to hepatitis A. Hepatitis B vaccine  You may need this if you have certain conditions or if you travel or work in places where you may be exposed to hepatitis B. Haemophilus influenzae type b (Hib) vaccine  You may need this if you have certain risk factors. Human papillomavirus (HPV) vaccine  If recommended by your health care provider, you may need three doses over 6 months. You may receive vaccines as individual doses or as more than one vaccine together in one shot (combination vaccines). Talk with your health care provider about the risks and benefits of combination vaccines. What tests do I need? Blood tests  Lipid and cholesterol levels. These may be checked every 5 years, or more frequently if you are over 50 years old.  Hepatitis C test.  Hepatitis B test. Screening  Lung cancer screening. You may have this screening every year starting at age 55 if you have a 30-pack-year history of smoking and currently smoke or have quit within the past 15 years.  Prostate cancer screening. Recommendations will vary depending on your family history and other risks.  Colorectal cancer screening. All adults should have this screening starting at age 50 and continuing until age 75. Your health care provider may recommend screening at age 45 if you are at increased risk. You will have tests every 1-10 years, depending on your results and the type of screening test.  Diabetes screening. This is done by checking your blood sugar (glucose) after you have not eaten   for a while (fasting). You may have this done every 1-3 years.  Sexually transmitted disease (STD) testing. Follow these instructions at home: Eating and drinking  Eat a diet that includes fresh fruits and vegetables, whole grains, lean protein, and low-fat dairy products.  Take vitamin and mineral supplements as recommended  by your health care provider.  Do not drink alcohol if your health care provider tells you not to drink.  If you drink alcohol: ? Limit how much you have to 0-2 drinks a day. ? Be aware of how much alcohol is in your drink. In the [place] one drink equals one 12 oz bottle of beer (355 mL), one 5 oz glass of wine (148 mL), or one 1 oz glass of hard liquor (44 mL). Lifestyle  Take daily care of your teeth and gums.  Stay active. Exercise for at least 30 minutes on 5 or more days each week.  Do not use any products that contain nicotine or tobacco, such as cigarettes, e-cigarettes, and chewing tobacco. If you need help quitting, ask your health care provider.  If you are sexually active, practice safe sex. Use a condom or other form of protection to prevent STIs (sexually transmitted infections).  Talk with your health care provider about taking a low-dose aspirin every day starting at age 33. What's next?  Go to your health care provider once a year for a well check visit.  Ask your health care provider how often you should have your eyes and teeth checked.  Stay up to date on all vaccines. This information is not intended to replace advice given to you by your health care provider. Make sure you discuss any questions you have with your health care provider. Document Released: 03/01/2015 Document Revised: 01/27/2018 Document Reviewed: 01/27/2018 Elsevier Patient Education  2020 [employer].

## 2018-12-12 NOTE — Assessment & Plan Note (Signed)
Patient was seen last week for this problem He was treated presumptively for UTI, but urine culture returned with no growth States he was improving with Bactrim, but after getting side effects, he stopped this prematurely Of note, patient's wife was treated for malodorous vaginal discharge and found to have a retained tampon on exam recently (unclear if he knows about the tampon, and I did not discuss this with him due to patient privacy, but this may be important for urology to know) I also wonder if he could have gotten some sort of bacterial infection from his contact with the retained tampon as they had had sexual intercourse while it was there per the patient's wife's report Despite negative culture, his symptoms do seem consistent with possible UTI/prostatitis (though his prostatitis would be mild as he does not have any fevers and he appears overall well), so we will treat with 1 week course of Cipro We will also refer to urology for further evaluation Discussed return precautions

## 2018-12-13 ENCOUNTER — Telehealth: Payer: Self-pay

## 2018-12-13 LAB — LIPID PANEL
Chol/HDL Ratio: 4.7 ratio (ref 0.0–5.0)
Cholesterol, Total: 198 mg/dL (ref 100–199)
HDL: 42 mg/dL (ref >39)
LDL Chol Calc (NIH): 141 mg/dL — ABNORMAL HIGH (ref 0–99)
Triglycerides: 80 mg/dL (ref 0–149)
VLDL Cholesterol Cal: 15 mg/dL (ref 5–40)

## 2018-12-13 LAB — COMPREHENSIVE METABOLIC PANEL
ALT: 19 IU/L (ref 0–44)
AST: 19 IU/L (ref 0–40)
Albumin/Globulin Ratio: 1.8 (ref 1.2–2.2)
Albumin: 4.6 g/dL (ref 4.0–5.0)
Alkaline Phosphatase: 65 IU/L (ref 39–117)
BUN/Creatinine Ratio: 12 (ref 9–20)
BUN: 15 mg/dL (ref 6–24)
Bilirubin Total: 0.7 mg/dL (ref 0.0–1.2)
CO2: 23 mmol/L (ref 20–29)
Calcium: 9.7 mg/dL (ref 8.7–10.2)
Chloride: 100 mmol/L (ref 96–106)
Creatinine, Ser: 1.22 mg/dL (ref 0.76–1.27)
GFR calc Af Amer: 85 mL/min/{1.73_m2} (ref >59)
GFR calc non Af Amer: 74 mL/min/{1.73_m2} (ref >59)
Globulin, Total: 2.5 g/dL (ref 1.5–4.5)
Glucose: 89 mg/dL (ref 65–99)
Potassium: 3.7 mmol/L (ref 3.5–5.2)
Sodium: 138 mmol/L (ref 134–144)
Total Protein: 7.1 g/dL (ref 6.0–8.5)

## 2018-12-13 LAB — RPR: RPR Ser Ql: NONREACTIVE

## 2018-12-13 LAB — URINE CYTOLOGY ANCILLARY ONLY
Chlamydia: NEGATIVE
Comment: NEGATIVE
Comment: NEGATIVE
Comment: NORMAL
Neisseria Gonorrhea: NEGATIVE
Trichomonas: NEGATIVE

## 2018-12-13 LAB — HIV ANTIBODY (ROUTINE TESTING W REFLEX): HIV Screen 4th Generation wRfx: NONREACTIVE

## 2018-12-13 NOTE — Telephone Encounter (Signed)
-----   Message from Virginia Crews, MD sent at 12/13/2018  9:08 AM EDT ----- Normal labs, except cholesterol is elevated.  This is not to a range where we need to consider medication yet, but I do recommend diet low in saturated fat and regular exercise - 30 min at least 5 times per week.  Urine tests are pending

## 2018-12-13 NOTE — Telephone Encounter (Signed)
LMTCB 12/13/2018  Thanks,   -Mickel Baas

## 2018-12-13 NOTE — Telephone Encounter (Signed)
Pt advised.   Thanks,   -Victoriana Aziz  

## 2018-12-14 ENCOUNTER — Telehealth: Payer: Self-pay

## 2018-12-14 NOTE — Telephone Encounter (Signed)
-----   Message from Virginia Crews, MD sent at 12/14/2018  8:13 AM EDT ----- STD screen negative

## 2018-12-14 NOTE — Telephone Encounter (Signed)
Pt advised.   Thanks,   -Tayton Decaire  

## 2018-12-16 ENCOUNTER — Telehealth: Payer: Self-pay | Admitting: Family Medicine

## 2018-12-16 MED ORDER — CIPROFLOXACIN HCL 500 MG PO TABS: 500.0000 mg | ORAL_TABLET | Freq: Two times a day (BID) | ORAL | 0 refills | Status: AC

## 2018-12-16 NOTE — Telephone Encounter (Signed)
Pt called saying he did get an apt with [facility] but its not until Dec 2nd.  He is finishing up his antibiotic and wants to know if Dr. B can send another week of the antibiotics.  He is getting better but wants to make sure it gets cleared up.  [facility] [address]  [place]

## 2018-12-16 NOTE — Telephone Encounter (Signed)
OK to refill the Cipro for 1 more week.

## 2018-12-23 ENCOUNTER — Other Ambulatory Visit: Payer: Self-pay | Admitting: Family Medicine

## 2018-12-23 NOTE — Telephone Encounter (Signed)
Patient has already taken 2 weeks of Cipro. Does not need to continue through Urology appointment.

## 2018-12-23 NOTE — Telephone Encounter (Signed)
Please advise. Thank you

## 2018-12-29 ENCOUNTER — Encounter: Payer: Self-pay | Admitting: Family Medicine

## 2019-01-11 ENCOUNTER — Ambulatory Visit: Payer: BLUE CROSS/BLUE SHIELD | Admitting: Urology

## 2019-01-16 ENCOUNTER — Encounter: Payer: Self-pay | Admitting: Urology

## 2019-01-16 ENCOUNTER — Other Ambulatory Visit: Payer: Self-pay

## 2019-01-16 ENCOUNTER — Ambulatory Visit: Payer: BLUE CROSS/BLUE SHIELD | Admitting: Urology

## 2019-01-16 VITALS — BP 124/85 | HR 105 | Ht 69.0 in | Wt 195.0 lb

## 2019-01-16 DIAGNOSIS — G894 Chronic pain syndrome: Secondary | ICD-10-CM | POA: Diagnosis not present

## 2019-01-16 DIAGNOSIS — R300 Dysuria: Secondary | ICD-10-CM | POA: Diagnosis not present

## 2019-01-16 DIAGNOSIS — N411 Chronic prostatitis: Secondary | ICD-10-CM | POA: Diagnosis not present

## 2019-01-16 LAB — MICROSCOPIC EXAMINATION
Bacteria, UA: NONE SEEN
Epithelial Cells (non renal): NONE SEEN /HPF (ref 0–10)
RBC, Urine: NONE SEEN /HPF (ref 0–2)

## 2019-01-16 LAB — URINALYSIS, COMPLETE
Bilirubin, UA: NEGATIVE
Glucose, UA: NEGATIVE
Ketones, UA: NEGATIVE
Leukocytes,UA: NEGATIVE
Nitrite, UA: NEGATIVE
Protein,UA: NEGATIVE
RBC, UA: NEGATIVE
Specific Gravity, UA: >1.03 — ABNORMAL HIGH (ref 1.005–1.030)
Urobilinogen, Ur: 0.2 mg/dL (ref 0.2–1.0)
pH, UA: 5.5 (ref 5.0–7.5)

## 2019-01-16 MED ORDER — TAMSULOSIN HCL 0.4 MG PO CAPS: 0.4000 mg | ORAL_CAPSULE | Freq: Every day | ORAL | 1 refills | Status: DC

## 2019-01-16 NOTE — Progress Notes (Signed)
01/16/2019 9:54 AM   Ian Harper May 22, 1980 [id number]  Referring provider: Virginia Crews, [address] [place],  [place] [address]  Chief Complaint  Patient presents with  . Dysuria    HPI: Ian Harper is a 38 y.o. male seen in consultation at the request of Dr. Brita Romp for evaluation of dysuria and rectal pain.  He was seen in mid October 2020 with a 9-day history of Vague rectal pain with a sharp pain at the end of urination.  He also complained of a burning sensation in his penis which was worse with bladder fullness.  Rectal pain was described as a fullness and worse at night when laying in the supine position.  He has mild dysuria andsome post void dribbling.  Severity was rated mild.  Urinalysis was unremarkable and urine culture was negative.  He was treated with a 7-day course of Bactrim and given an additional 1 week course of Cipro.  Record review remarkable for similar symptoms in 2017 seen at [facility].  He was also seen by gastroenterology for chronic rectal pain.  He was being seen by Dr. Yves Dill however records are not available for review.  He did say he saw Dr. Yves Dill recently and was given a prescription for Cipro however states he was still taking the Cipro from his PCP.  Denies gross hematuria.   PMH: Past Medical History:  Diagnosis Date  . Allergy   . Anxiety   . Mitral valve prolapse     Surgical History: Past Surgical History:  Procedure Laterality Date  . APPENDECTOMY      Home Medications:  Allergies as of 01/16/2019   No Known Allergies     Medication List       Accurate as of January 16, 2019  9:54 AM. If you have any questions, ask your nurse or doctor.        albuterol 108 (90 Base) MCG/ACT inhaler Commonly known as: VENTOLIN HFA Inhale 2 puffs into the lungs every 6 (six) hours as needed for wheezing or shortness of breath.   busPIRone 5 MG tablet Commonly known as: BUSPAR Take 1 tablet (5 mg total)  by mouth 3 (three) times daily.   fluticasone 110 MCG/ACT inhaler Commonly known as: Flovent HFA Inhale 1 puff into the lungs 2 (two) times a day.       Allergies: No Known Allergies  Family History: Family History  Problem Relation Age of Onset  . Breast cancer Mother 43  . Stroke Father 34  . Hyperlipidemia Father   . Diabetes Sister        type 2, obesity  . Lung cancer Maternal Grandmother   . Stroke Maternal Grandfather   . Diabetes Maternal Grandfather   . Stroke Paternal Grandmother   . Hyperlipidemia Paternal Grandmother   . Diabetes Maternal Uncle     Social History:  reports that he has never smoked. He has never used smokeless tobacco. He reports current alcohol use of about 3.0 standard drinks of alcohol per week. He reports that he does not use drugs.  ROS: UROLOGY Frequent Urination?: Yes Hard to postpone urination?: No Burning/pain with urination?: Yes Get up at night to urinate?: Yes Leakage of urine?: No Urine stream starts and stops?: No Trouble starting stream?: No Do you have to strain to urinate?: No Blood in urine?: No Urinary tract infection?: No Sexually transmitted disease?: No Injury to kidneys or bladder?: No Painful intercourse?: Yes Weak stream?: No Erection problems?:  No Penile pain?: Yes  Gastrointestinal Nausea?: No Vomiting?: No Indigestion/heartburn?: No Diarrhea?: No Constipation?: No  Constitutional Fever: No Night sweats?: No Weight loss?: No Fatigue?: No  Skin Skin rash/lesions?: No Itching?: No  Eyes Blurred vision?: No Double vision?: No  Ears/Nose/Throat Sore throat?: No Sinus problems?: No  Hematologic/Lymphatic Swollen glands?: No Easy bruising?: No  Cardiovascular Leg swelling?: No Chest pain?: No  Respiratory Cough?: No Shortness of breath?: No  Endocrine Excessive thirst?: No  Musculoskeletal Back pain?: No Joint pain?: No  Neurological Headaches?: No Dizziness?: No   Psychologic Depression?: No Anxiety?: No  Physical Exam: BP 124/85   Pulse (!) 105   Ht 5\' 9"  (1.753 m)   Wt 195 lb (88.5 kg)   BMI 28.80 kg/m   Constitutional:  Alert and oriented, No acute distress. HEENT: [place] AT, moist mucus membranes.  Trachea midline, no masses. Cardiovascular: No clubbing, cyanosis, or edema. Respiratory: Normal respiratory effort, no increased work of breathing. GI: Abdomen is soft, nontender, nondistended, no abdominal masses GU: Phallus without lesions, testes descended bilaterally without masses or tenderness.  Spermatic cord/epididymis palpably normal bilaterally.  Prostate 30 g with tenderness  left >right.  Palpation reproduces his penile pain.  Moderate tenderness left pelvic floor. Skin: No rashes, bruises or suspicious lesions. Neurologic: Grossly intact, no focal deficits, moving all 4 extremities. Psychiatric: Normal mood and affect.  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  We discussed the unknown etiology.  Urinalysis is negative and antibiotics are not indicated.  Have recommended an initial trial of tamsulosin.  Follow-up in 4 weeks for cystoscopy if still symptomatic.     , MD  [facility] [address], [address] [place], [place] [phone]

## 2019-01-17 ENCOUNTER — Encounter: Payer: Self-pay | Admitting: Urology

## 2019-01-18 ENCOUNTER — Ambulatory Visit: Payer: BLUE CROSS/BLUE SHIELD | Admitting: Urology

## 2019-02-20 ENCOUNTER — Telehealth: Payer: Self-pay | Admitting: Urology

## 2019-02-20 NOTE — Telephone Encounter (Signed)
Pt states that the Tamsulosin has started making him feel dizzy when he goes from sitting to standing. He has stopped taking it for now. Just FYI.

## 2019-02-22 NOTE — Telephone Encounter (Signed)
This is a known side effect of tamsulosin.  If this medication was helping his symptoms can prescribe a different medication in that class.  It looks like he has a follow-up scheduled later this week.

## 2019-02-22 NOTE — Telephone Encounter (Signed)
See my chart message

## 2019-02-24 ENCOUNTER — Other Ambulatory Visit: Payer: Self-pay

## 2019-02-24 ENCOUNTER — Ambulatory Visit: Payer: BLUE CROSS/BLUE SHIELD | Admitting: Urology

## 2019-02-24 ENCOUNTER — Encounter: Payer: Self-pay | Admitting: Urology

## 2019-02-24 VITALS — BP 129/79 | HR 80 | Ht 69.0 in | Wt 195.0 lb

## 2019-02-24 DIAGNOSIS — G894 Chronic pain syndrome: Secondary | ICD-10-CM | POA: Diagnosis not present

## 2019-02-24 DIAGNOSIS — N411 Chronic prostatitis: Secondary | ICD-10-CM

## 2019-02-24 LAB — MICROSCOPIC EXAMINATION
Bacteria, UA: NONE SEEN
RBC, Urine: NONE SEEN /hpf (ref 0–2)

## 2019-02-24 LAB — URINALYSIS, COMPLETE
Bilirubin, UA: NEGATIVE
Glucose, UA: NEGATIVE
Ketones, UA: NEGATIVE
Leukocytes,UA: NEGATIVE
Nitrite, UA: NEGATIVE
Protein,UA: NEGATIVE
RBC, UA: NEGATIVE
Specific Gravity, UA: 1.025 (ref 1.005–1.030)
Urobilinogen, Ur: 0.2 mg/dL (ref 0.2–1.0)
pH, UA: 5.5 (ref 5.0–7.5)

## 2019-02-24 MED ORDER — SILODOSIN 8 MG PO CAPS: 8.0000 mg | ORAL_CAPSULE | Freq: Every day | ORAL | 0 refills | Status: DC

## 2019-02-24 MED ORDER — LIDOCAINE HCL URETHRAL/MUCOSAL 2 % EX GEL
1.0000 "application " | Freq: Once | CUTANEOUS | Status: AC
  Administered 2019-02-24: 1 via URETHRAL

## 2019-02-24 NOTE — Progress Notes (Signed)
   02/24/19  CC:  Chief Complaint  Patient presents with  . Follow-up    HPI: 39 y.o. male seen 01/16/2019 for vague rectal pain and sharp pain at the end of urination.  Refer to my note for details.  He was given a trial of tamsulosin however states after taking it 1-2 days he had orthostatic symptoms requiring him to discontinue.  Blood pressure 129/79, pulse 80, height 5\' 9"  (1.753 m), weight 195 lb (88.5 kg). NED. A&Ox3.   No respiratory distress   Abd soft, NT, ND Normal phallus with bilateral descended testicles  Cystoscopy Procedure Note  Patient identification was confirmed, informed consent was obtained, and patient was prepped using Betadine solution.  Lidocaine jelly was administered per urethral meatus.     Pre-Procedure: - Inspection reveals a normal caliber ureteral meatus.  Procedure: The flexible cystoscope was introduced without difficulty - No urethral strictures/lesions are present. - Nonocclusive prostate, hypervascularity - Mild elevation bladder neck - Bilateral ureteral orifices identified - Bladder mucosa  reveals no ulcers, tumors, or lesions - No bladder stones - No trabeculation  Retroflexion shows no abnormalities   Post-Procedure: - Patient tolerated the procedure well  Assessment/ Plan: -No evidence of urethral stricture or other abnormalities -Moderate inflammatory changes prostate  -Trial silodosin 8 mg daily x30 days -If no improvement refer to [facility] for pelvic floor PT.  , MD

## 2019-05-05 ENCOUNTER — Ambulatory Visit: Payer: Self-pay | Attending: Internal Medicine

## 2019-05-05 DIAGNOSIS — Z23 Encounter for immunization: Secondary | ICD-10-CM

## 2019-05-05 NOTE — Progress Notes (Signed)
   Covid-19 Vaccination Clinic  Name:  Eswin Worrell    MRN: [id number] DOB: [birth year]  05/05/2019  Mr. Denardo was observed post Covid-19 immunization for 15 minutes without incident. He was provided with Vaccine Information Sheet and instruction to access the V-Safe system.   Mr. Weatherly was instructed to call 911 with any severe reactions post vaccine: Marland Kitchen Difficulty breathing  . Swelling of face and throat  . A fast heartbeat  . A bad rash all over body  . Dizziness and weakness   Immunizations Administered    Name Date Dose VIS Date Route   Pfizer COVID-19 Vaccine 05/05/2019  5:39 PM 0.3 mL 01/27/2019 Intramuscular   Manufacturer: [employer], [employer]   Lot: [id number]   NDC: [address]-6

## 2019-05-26 ENCOUNTER — Ambulatory Visit: Payer: Self-pay | Attending: Internal Medicine

## 2019-05-26 DIAGNOSIS — Z23 Encounter for immunization: Secondary | ICD-10-CM

## 2019-05-26 NOTE — Progress Notes (Signed)
   Covid-19 Vaccination Clinic  Name:  Ian Harper    MRN: [id number] DOB: Jul 17, 1980  05/26/2019  Mr. Ian Harper was observed post Covid-19 immunization for 15 minutes without incident. He was provided with Vaccine Information Sheet and instruction to access the V-Safe system.   Mr. Ian Harper was instructed to call 911 with any severe reactions post vaccine: Marland Kitchen Difficulty breathing  . Swelling of face and throat  . A fast heartbeat  . A bad rash all over body  . Dizziness and weakness   Immunizations Administered    Name Date Dose VIS Date Route   Pfizer COVID-19 Vaccine 05/26/2019  5:10 PM 0.3 mL 01/27/2019 Intramuscular   Manufacturer: [employer], [employer]   Lot: [id number]   NDC: [address]-5

## 2019-06-02 ENCOUNTER — Encounter: Payer: Self-pay | Admitting: Family Medicine

## 2019-08-18 ENCOUNTER — Other Ambulatory Visit: Payer: Self-pay

## 2019-08-18 ENCOUNTER — Encounter: Payer: Self-pay | Admitting: Family Medicine

## 2019-08-18 ENCOUNTER — Ambulatory Visit (INDEPENDENT_AMBULATORY_CARE_PROVIDER_SITE_OTHER): Payer: 59 | Admitting: Family Medicine

## 2019-08-18 VITALS — BP 106/72 | HR 87 | Temp 97.5°F | Wt 195.8 lb

## 2019-08-18 DIAGNOSIS — R002 Palpitations: Secondary | ICD-10-CM

## 2019-08-18 NOTE — Patient Instructions (Signed)
.   Please review the attached list of medications and notify my office if there are any errors.   . Please bring all of your medications to every appointment so we can make sure that our medication list is the same as yours.   

## 2019-08-18 NOTE — Progress Notes (Signed)
Established patient visit   Patient: Ian Harper   DOB: [birth year]   39 y.o. Male  MRN: [id number] Visit Date: 08/18/2019  Today's healthcare provider: Mila Merry, MD   Chief Complaint  Patient presents with  . Palpitations   I,Latasha Walston,acting as a scribe for Mila Merry, MD.,have documented all relevant documentation on the behalf of Mila Merry, MD,as directed by  Mila Merry, MD while in the presence of Mila Merry, MD.  Subjective    Palpitations  This is a new problem. Episode onset: 2 months ago  The problem occurs intermittently. The problem has been unchanged. The symptoms are aggravated by exercise. Associated symptoms include anxiety and dizziness. Pertinent negatives include no chest pain, fever, nausea, shortness of breath or vomiting. Treatments tried: Ibuprofen. The treatment provided mild relief. His past medical history is significant for anxiety. mitral valve prolapse   He states that he first started having palpitations when he was in his 57s. He states he had a Holter monitor at that time, but episodes were infrequent. He also had echocardiogram at that time resulting in the MVP diagnoses. He states episodes have been much more frequent in the last 2 months with episodes 20-100 times a day and just last a few seconds. Worse with exercise. He avoids caffeine and any other stimulants.      Medications: Outpatient Medications Prior to Visit  Medication Sig  . [DISCONTINUED] silodosin (RAPAFLO) 8 MG CAPS capsule Take 1 capsule (8 mg total) by mouth daily with breakfast.  . [DISCONTINUED] tamsulosin (FLOMAX) 0.4 MG CAPS capsule Take 1 capsule (0.4 mg total) by mouth daily.  . busPIRone (BUSPAR) 5 MG tablet Take 1 tablet (5 mg total) by mouth 3 (three) times daily. (Patient not taking: Reported on 08/18/2019)  . [DISCONTINUED] albuterol (VENTOLIN HFA) 108 (90 Base) MCG/ACT inhaler Inhale 2 puffs into the lungs every 6 (six) hours as needed for wheezing  or shortness of breath. (Patient not taking: Reported on 08/18/2019)  . [DISCONTINUED] fluticasone (FLOVENT HFA) 110 MCG/ACT inhaler Inhale 1 puff into the lungs 2 (two) times a day. (Patient not taking: Reported on 08/18/2019)   No facility-administered medications prior to visit.    Review of Systems  Constitutional: Negative for appetite change, chills and fever.  Respiratory: Negative for chest tightness, shortness of breath and wheezing.   Cardiovascular: Negative for chest pain and palpitations.  Gastrointestinal: Negative for abdominal pain, nausea and vomiting.  Neurological: Positive for dizziness.  Psychiatric/Behavioral: The patient is nervous/anxious.       Objective    BP 106/72 (BP Location: Right Arm, Patient Position: Sitting, Cuff Size: Normal)   Pulse 87   Temp (!) 97.5 F (36.4 C) (Temporal)   Wt 195 lb 12.8 oz (88.8 kg)   BMI 28.91 kg/m    Physical Exam   General: Appearance:     Overweight male in no acute distress  Eyes:    PERRL, conjunctiva/corneas clear, EOM's intact       Lungs:     Clear to auscultation bilaterally, respirations unlabored  Heart:    Normal heart rate. Sinus arrhythmia. No murmurs, rubs, or gallops.   MS:   All extremities are intact.   Neurologic:   Awake, alert, oriented x 3. No apparent focal neurological           defect.       EKG: NSR  Assessment & Plan     1. Palpitation Has had intermittently for the last 20 years,  but much more frequently the last couple of months with no change in diet or medications.  - EKG 12-Lead - T4, free - TSH - Magnesium - Basic metabolic panel - Holter monitor - 24 hour; Future   No follow-ups on file.      The entirety of the information documented in the History of Present Illness, Review of Systems and Physical Exam were personally obtained by me. Portions of this information were initially documented by the CMA and reviewed by me for thoroughness and accuracy.      Mila Merry,  MD  [facility] [phone] (phone) [phone] (fax)  [facility]

## 2019-08-19 LAB — BASIC METABOLIC PANEL
BUN/Creatinine Ratio: 13 (ref 9–20)
BUN: 15 mg/dL (ref 6–24)
CO2: 24 mmol/L (ref 20–29)
Calcium: 9.5 mg/dL (ref 8.7–10.2)
Chloride: 102 mmol/L (ref 96–106)
Creatinine, Ser: 1.2 mg/dL (ref 0.76–1.27)
GFR calc Af Amer: 86 mL/min/{1.73_m2} (ref >59)
GFR calc non Af Amer: 75 mL/min/{1.73_m2} (ref >59)
Glucose: 93 mg/dL (ref 65–99)
Potassium: 4.1 mmol/L (ref 3.5–5.2)
Sodium: 139 mmol/L (ref 134–144)

## 2019-08-19 LAB — MAGNESIUM: Magnesium: 2 mg/dL (ref 1.6–2.3)

## 2019-08-19 LAB — T4, FREE: Free T4: 1.25 ng/dL (ref 0.82–1.77)

## 2019-08-19 LAB — TSH: TSH: 2.09 u[IU]/mL (ref 0.450–4.500)

## 2019-08-29 ENCOUNTER — Ambulatory Visit
Admission: RE | Admit: 2019-08-29 | Discharge: 2019-08-29 | Disposition: A | Discharge status: Home / Self Care | Payer: 59 | Source: Ambulatory Visit | Attending: Family Medicine | Admitting: Family Medicine

## 2019-08-29 ENCOUNTER — Other Ambulatory Visit: Payer: Self-pay

## 2019-08-29 DIAGNOSIS — R002 Palpitations: Secondary | ICD-10-CM | POA: Diagnosis not present

## 2019-09-14 ENCOUNTER — Encounter: Payer: Self-pay | Admitting: Family Medicine

## 2019-10-20 ENCOUNTER — Telehealth (INDEPENDENT_AMBULATORY_CARE_PROVIDER_SITE_OTHER): Payer: 59 | Admitting: Physician Assistant

## 2019-10-20 ENCOUNTER — Other Ambulatory Visit: Payer: Self-pay

## 2019-10-20 DIAGNOSIS — J069 Acute upper respiratory infection, unspecified: Secondary | ICD-10-CM

## 2019-10-20 MED ORDER — AMOXICILLIN-POT CLAVULANATE 875-125 MG PO TABS: 1.0000 | ORAL_TABLET | Freq: Two times a day (BID) | ORAL | 0 refills | Status: AC

## 2019-10-20 NOTE — Progress Notes (Signed)
MyChart Video Visit    Virtual Visit via Video Note   This visit type was conducted due to national recommendations for restrictions regarding the COVID-19 Pandemic (e.g. social distancing) in an effort to limit this patient's exposure and mitigate transmission in our community. This patient is at least at moderate risk for complications without adequate follow up. This format is felt to be most appropriate for this patient at this time. Physical exam was limited by quality of the video and audio technology used for the visit.   Patient location: Home Provider location: Office    I discussed the limitations of evaluation and management by telemedicine and the availability of in person appointments. The patient expressed understanding and agreed to proceed.  Patient: Ian Harper   DOB: 1980-06-23   39 y.o. Male  MRN: [id number] Visit Date: 10/20/2019  Today's healthcare provider: Trey Sailors, PA-C   Chief Complaint  Patient presents with  . Headache  . Sore Throat   Subjective    HPI  Patient presents today with sore throat, headache, and right ear pain X 1 week. He reports that his 3 small children was diagnosed with inner ear infections, and feels that he is having similar symptoms. He denies fever. He has only taken Tylenol for symptoms. He also reports that he is having facial pain. Vaccinated 05/26/2019. Children were tested for COVID and were negative, tested 2-3 days after symptoms. Kids tested for strep throat and were negative.      Medications: Outpatient Medications Prior to Visit  Medication Sig  . busPIRone (BUSPAR) 5 MG tablet Take 1 tablet (5 mg total) by mouth 3 (three) times daily. (Patient not taking: Reported on 08/18/2019)   No facility-administered medications prior to visit.    Review of Systems  Constitutional: Positive for activity change and fatigue.  HENT: Positive for congestion, ear pain, postnasal drip, sinus pressure, sinus pain and sore  throat.   Respiratory: Positive for cough.   Neurological: Positive for headaches.       Objective    There were no vitals taken for this visit.    Physical Exam Constitutional:      Appearance: Normal appearance.  Pulmonary:     Effort: Pulmonary effort is normal. No respiratory distress.  Neurological:     Mental Status: He is alert.  Psychiatric:        Mood and Affect: Mood normal.        Behavior: Behavior normal.        Assessment & Plan    1. Upper respiratory tract infection, unspecified type  Do think this is viral. Encouraged patient to wait another three days to see if he will improve. Since we are heading into a holiday weekend, I have sent in a prescription of Aumentin with precautions about when to take this including progressive worsening past 10 days and double sickening.   - symptoms and exam c/w viral URI  - no evidence of strep pharyngitis, CAP, AOM, bacterial sinusitis, or other bacterial infection - concern for possible COVID19 infection - will send for outpatient testing - discussed need to quarantine 10 days from start of symptoms and until fever-free for at least 48 hours - discussed need to quarantine household members - discussed symptomatic management, natural course, and return precautions -Mucinex, sudafed, delsym all fine to take together    Return if symptoms worsen or fail to improve.     I discussed the assessment and treatment plan with the patient.  The patient was provided an opportunity to ask questions and all were answered. The patient agreed with the plan and demonstrated an understanding of the instructions.   The patient was advised to call back or seek an in-person evaluation if the symptoms worsen or if the condition fails to improve as anticipated.   ITrey Sailors, PA-C, have reviewed all documentation for this visit. The documentation on 10/20/19 for the exam, diagnosis, procedures, and orders are all accurate and  complete.    Maryella Shivers [facility] [phone] (phone) [phone] (fax)  [facility]

## 2019-10-20 NOTE — Patient Instructions (Signed)

## 2019-12-13 ENCOUNTER — Encounter: Payer: BLUE CROSS/BLUE SHIELD | Admitting: Family Medicine

## 2020-02-20 ENCOUNTER — Ambulatory Visit (INDEPENDENT_AMBULATORY_CARE_PROVIDER_SITE_OTHER): Payer: 59 | Admitting: Family Medicine

## 2020-02-20 ENCOUNTER — Encounter: Payer: Self-pay | Admitting: Family Medicine

## 2020-02-20 ENCOUNTER — Other Ambulatory Visit: Payer: Self-pay

## 2020-02-20 VITALS — BP 123/76 | HR 80 | Temp 98.3°F | Ht 69.0 in | Wt 197.0 lb

## 2020-02-20 DIAGNOSIS — Z83438 Family history of other disorder of lipoprotein metabolism and other lipidemia: Secondary | ICD-10-CM | POA: Diagnosis not present

## 2020-02-20 DIAGNOSIS — Z1159 Encounter for screening for other viral diseases: Secondary | ICD-10-CM | POA: Diagnosis not present

## 2020-02-20 DIAGNOSIS — E663 Overweight: Secondary | ICD-10-CM | POA: Diagnosis not present

## 2020-02-20 DIAGNOSIS — F411 Generalized anxiety disorder: Secondary | ICD-10-CM

## 2020-02-20 DIAGNOSIS — Z0000 Encounter for general adult medical examination without abnormal findings: Secondary | ICD-10-CM

## 2020-02-20 DIAGNOSIS — K5900 Constipation, unspecified: Secondary | ICD-10-CM

## 2020-02-20 MED ORDER — BUSPIRONE HCL 5 MG PO TABS: 5.0000 mg | ORAL_TABLET | Freq: Three times a day (TID) | ORAL | 1 refills | Status: DC

## 2020-02-20 NOTE — Assessment & Plan Note (Signed)
Intermittent issue Low fiber diet over the holidays likely contributes Try miralax and increasing fiber in diet

## 2020-02-20 NOTE — Assessment & Plan Note (Signed)
Discussed importance of healthy weight management Discussed diet and exercise Screening labs today 

## 2020-02-20 NOTE — Progress Notes (Signed)
Complete physical exam   Patient: Ian Harper   DOB: Sep 30, 1980   40 y.o. Male  MRN: [id number] Visit Date: 02/20/2020  Today's healthcare provider: Shirlee Latch, MD   Chief Complaint  Patient presents with  . Annual Exam   Subjective    Ian Harper is a 40 y.o. male who presents today for a complete physical exam.  He reports consuming a general diet. Exercises regularly He generally feels fairly well. He reports sleeping well. He does have additional problems to discuss today.  Constipation This is a chronic problem. The current episode started more than 1 year ago. The problem has been gradually worsening since onset. The patient is on a high fiber diet. He exercises regularly. There has been adequate water intake. Associated symptoms include abdominal pain, back pain, bloating, flatus and rectal pain. Pertinent negatives include no diarrhea, hemorrhoids, nausea or vomiting. He has tried fiber for the symptoms. The treatment provided no relief.      Past Medical History:  Diagnosis Date  . Allergy   . Anxiety   . Mitral valve prolapse    Past Surgical History:  Procedure Laterality Date  . APPENDECTOMY     Social History   Socioeconomic History  . Marital status: Married    Spouse name: Not on file  . Number of children: 3  . Years of education: Not on file  . Highest education level: Not on file  Occupational History  . Occupation: [employer]    Comment: looking for teaching job  Tobacco Use  . Smoking status: Never Smoker  . Smokeless tobacco: Never Used  Vaping Use  . Vaping Use: Never used  Substance and Sexual Activity  . Alcohol use: Yes    Alcohol/week: 3.0 standard drinks    Types: 3 Cans of beer per week  . Drug use: No  . Sexual activity: Yes    Partners: Female  Other Topics Concern  . Not on file  Social History Narrative  . Not on file   Social Determinants of Health   Financial Resource Strain: Not on file  Food Insecurity: Not on  file  Transportation Needs: Not on file  Physical Activity: Not on file  Stress: Not on file  Social Connections: Not on file  Intimate Partner Violence: Not on file   Family Status  Relation Name Status  . Mother  Alive  . Father  Alive  . Sister  (Not Specified)  . MGM  (Not Specified)  . MGF  (Not Specified)  . PGM  (Not Specified)  . Mat Uncle  (Not Specified)   Family History  Problem Relation Age of Onset  . Breast cancer Mother 91  . Stroke Father 41  . Hyperlipidemia Father   . Diabetes Sister        type 2, obesity  . Lung cancer Maternal Grandmother   . Stroke Maternal Grandfather   . Diabetes Maternal Grandfather   . Stroke Paternal Grandmother   . Hyperlipidemia Paternal Grandmother   . Diabetes Maternal Uncle    No Known Allergies  Patient Care Team: Erasmo Downer, MD as PCP - General (Family Medicine)   Medications: Outpatient Medications Prior to Visit  Medication Sig  . [DISCONTINUED] busPIRone (BUSPAR) 5 MG tablet Take 1 tablet (5 mg total) by mouth 3 (three) times daily. (Patient not taking: Reported on 08/18/2019)   No facility-administered medications prior to visit.    Review of Systems  Gastrointestinal: Positive for abdominal distention,  abdominal pain, bloating, constipation, flatus and rectal pain. Negative for anal bleeding, blood in stool, diarrhea, hemorrhoids, nausea and vomiting.  Musculoskeletal: Positive for back pain.      Objective    BP 123/76 (BP Location: Left Arm, Patient Position: Sitting, Cuff Size: Large)   Pulse 80   Temp 98.3 F (36.8 C) (Oral)   Ht 5\' 9"  (1.753 m)   Wt 197 lb (89.4 kg)   BMI 29.09 kg/m    Physical Exam Vitals reviewed.  Constitutional:      General: He is not in acute distress.    Appearance: Normal appearance. He is well-developed. He is not diaphoretic.  HENT:     Head: Normocephalic and atraumatic.     Right Ear: Tympanic membrane, ear canal and external ear normal.     Left Ear:  Tympanic membrane, ear canal and external ear normal.     Nose: Nose normal.     Mouth/Throat:     Mouth: Mucous membranes are moist.     Pharynx: Oropharynx is clear. No oropharyngeal exudate.  Eyes:     General: No scleral icterus.    Conjunctiva/sclera: Conjunctivae normal.     Pupils: Pupils are equal, round, and reactive to light.  Neck:     Thyroid: No thyromegaly.  Cardiovascular:     Rate and Rhythm: Normal rate and regular rhythm.     Pulses: Normal pulses.     Heart sounds: Normal heart sounds. No murmur heard.   Pulmonary:     Effort: Pulmonary effort is normal. No respiratory distress.     Breath sounds: Normal breath sounds. No wheezing or rales.  Abdominal:     General: Bowel sounds are normal. There is no distension.     Palpations: Abdomen is soft.     Tenderness: There is abdominal tenderness (diffuse, mild).  Musculoskeletal:        General: No deformity.     Cervical back: Neck supple.     Right lower leg: No edema.     Left lower leg: No edema.  Lymphadenopathy:     Cervical: No cervical adenopathy.  Skin:    General: Skin is warm and dry.     Findings: No rash.  Neurological:     Mental Status: He is alert and oriented to person, place, and time. Mental status is at baseline.     Sensory: No sensory deficit.     Motor: No weakness.     Gait: Gait normal.  Psychiatric:        Mood and Affect: Mood normal.        Behavior: Behavior normal.        Thought Content: Thought content normal.       Last depression screening scores PHQ 2/9 Scores 02/20/2020 12/12/2018 07/07/2018  PHQ - 2 Score 2 3 4   PHQ- 9 Score 5 4 7    Last fall risk screening Fall Risk  02/20/2020  Falls in the past year? 0  Number falls in past yr: 0  Injury with Fall? 0  Risk for fall due to : No Fall Risks  Follow up Falls evaluation completed   Last Audit-C alcohol use screening Alcohol Use Disorder Test (AUDIT) 02/20/2020  1. How often do you have a drink containing alcohol?  2  2. How many drinks containing alcohol do you have on a typical day when you are drinking? 0  3. How often do you have six or more drinks on one occasion? 0  AUDIT-C Score 2  Alcohol Brief Interventions/Follow-up AUDIT Score <7 follow-up not indicated   A score of 3 or more in women, and 4 or more in men indicates increased risk for alcohol abuse, EXCEPT if all of the points are from question 1   No results found for any visits on 02/20/20.  Assessment & Plan    Routine Health Maintenance and Physical Exam  Exercise Activities and Dietary recommendations Goals   None     Immunization History  Administered Date(s) Administered  . Hepatitis B 11/03/2013, 12/04/2013, 05/07/2014  . Influenza-Unspecified 02/04/2017, 10/28/2017  . PFIZER SARS-COV-2 Vaccination 05/05/2019, 05/26/2019  . Td 03/10/2017  . Tdap 10/29/2009    Health Maintenance  Topic Date Due  . Hepatitis C Screening  Never done  . INFLUENZA VACCINE  05/16/2020 (Originally 09/17/2019)  . TETANUS/TDAP  03/11/2027  . COVID-19 Vaccine  Completed  . HIV Screening  Completed    Discussed health benefits of physical activity, and encouraged him to engage in regular exercise appropriate for his age and condition.  Problem List Items Addressed This Visit      Other   Constipation    Intermittent issue Low fiber diet over the holidays likely contributes Try miralax and increasing fiber in diet      Overweight    Discussed importance of healthy weight management Discussed diet and exercise Screening labs today      Relevant Orders   Lipid panel   Comprehensive metabolic panel   Hepatitis C Antibody   Generalized anxiety disorder    Recent worsening Resume BuSpar Discussed that there are higher doses if needed      Relevant Medications   busPIRone (BUSPAR) 5 MG tablet   Family history of hyperlipidemia   Relevant Orders   Lipid panel   Comprehensive metabolic panel    Other Visit Diagnoses     Encounter for annual physical exam    -  Primary   Relevant Orders   Lipid panel   Comprehensive metabolic panel   Hepatitis C Antibody   Need for hepatitis C screening test       Relevant Orders   Hepatitis C Antibody      Will come back tomorrow for fasting labs and COVID 19 booster   Return in about 1 year (around 02/19/2021) for CPE.     I, Shirlee Latch, MD, have reviewed all documentation for this visit. The documentation on 02/20/20 for the exam, diagnosis, procedures, and orders are all accurate and complete.   Ovie Eastep, Marzella Schlein, MD, MPH [facility]

## 2020-02-20 NOTE — Patient Instructions (Signed)
Preventive Care 41-42 Years Old, Male Preventive care refers to lifestyle choices and visits with your health care provider that can promote health and wellness. This includes:  A yearly physical exam. This is also called an annual well check.  Regular dental and eye exams.  Immunizations.  Screening for certain conditions.  Healthy lifestyle choices, such as eating a healthy diet, getting regular exercise, not using drugs or products that contain nicotine and tobacco, and limiting alcohol use. What can I expect for my preventive care visit? Physical exam Your health care provider will check:  Height and weight. These may be used to calculate body mass index (BMI), which is a measurement that tells if you are at a healthy weight.  Heart rate and blood pressure.  Your skin for abnormal spots. Counseling Your health care provider may ask you questions about:  Alcohol, tobacco, and drug use.  Emotional well-being.  Home and relationship well-being.  Sexual activity.  Eating habits.  Work and work Statistician. What immunizations do I need?  Influenza (flu) vaccine  This is recommended every year. Tetanus, diphtheria, and pertussis (Tdap) vaccine  You may need a Td booster every 10 years. Varicella (chickenpox) vaccine  You may need this vaccine if you have not already been vaccinated. Zoster (shingles) vaccine  You may need this after age 64. Measles, mumps, and rubella (MMR) vaccine  You may need at least one dose of MMR if you were born in 1957 or later. You may also need a second dose. Pneumococcal conjugate (PCV13) vaccine  You may need this if you have certain conditions and were not previously vaccinated. Pneumococcal polysaccharide (PPSV23) vaccine  You may need one or two doses if you smoke cigarettes or if you have certain conditions. Meningococcal conjugate (MenACWY) vaccine  You may need this if you have certain conditions. Hepatitis A  vaccine  You may need this if you have certain conditions or if you travel or work in places where you may be exposed to hepatitis A. Hepatitis B vaccine  You may need this if you have certain conditions or if you travel or work in places where you may be exposed to hepatitis B. Haemophilus influenzae type b (Hib) vaccine  You may need this if you have certain risk factors. Human papillomavirus (HPV) vaccine  If recommended by your health care provider, you may need three doses over 6 months. You may receive vaccines as individual doses or as more than one vaccine together in one shot (combination vaccines). Talk with your health care provider about the risks and benefits of combination vaccines. What tests do I need? Blood tests  Lipid and cholesterol levels. These may be checked every 5 years, or more frequently if you are over 60 years old.  Hepatitis C test.  Hepatitis B test. Screening  Lung cancer screening. You may have this screening every year starting at age 43 if you have a 30-pack-year history of smoking and currently smoke or have quit within the past 15 years.  Prostate cancer screening. Recommendations will vary depending on your family history and other risks.  Colorectal cancer screening. All adults should have this screening starting at age 72 and continuing until age 2. Your health care provider may recommend screening at age 14 if you are at increased risk. You will have tests every 1-10 years, depending on your results and the type of screening test.  Diabetes screening. This is done by checking your blood sugar (glucose) after you have not eaten  for a while (fasting). You may have this done every 1-3 years.  Sexually transmitted disease (STD) testing. Follow these instructions at home: Eating and drinking  Eat a diet that includes fresh fruits and vegetables, whole grains, lean protein, and low-fat dairy products.  Take vitamin and mineral supplements as  recommended by your health care provider.  Do not drink alcohol if your health care provider tells you not to drink.  If you drink alcohol: ? Limit how much you have to 0-2 drinks a day. ? Be aware of how much alcohol is in your drink. In the [place] one drink equals one 12 oz bottle of beer (355 mL), one 5 oz glass of wine (148 mL), or one 1 oz glass of hard liquor (44 mL). Lifestyle  Take daily care of your teeth and gums.  Stay active. Exercise for at least 30 minutes on 5 or more days each week.  Do not use any products that contain nicotine or tobacco, such as cigarettes, e-cigarettes, and chewing tobacco. If you need help quitting, ask your health care provider.  If you are sexually active, practice safe sex. Use a condom or other form of protection to prevent STIs (sexually transmitted infections).  Talk with your health care provider about taking a low-dose aspirin every day starting at age 53. What's next?  Go to your health care provider once a year for a well check visit.  Ask your health care provider how often you should have your eyes and teeth checked.  Stay up to date on all vaccines. This information is not intended to replace advice given to you by your health care provider. Make sure you discuss any questions you have with your health care provider. Document Revised: 01/27/2018 Document Reviewed: 01/27/2018 Elsevier Patient Education  2020 [employer].

## 2020-02-20 NOTE — Assessment & Plan Note (Signed)
Recent worsening Resume BuSpar Discussed that there are higher doses if needed

## 2020-02-21 ENCOUNTER — Ambulatory Visit: Payer: 59 | Admitting: Physician Assistant

## 2020-02-21 ENCOUNTER — Telehealth: Payer: Self-pay | Admitting: Family Medicine

## 2020-02-21 NOTE — Telephone Encounter (Signed)
Patient called to ask if his appt. For this morning at 9am for a booster could be changed to Friday morning.  Tried to contact office, but could not make outbound call.  Please advise and call patient to reschedule if available.  CB# [phone]

## 2020-02-23 ENCOUNTER — Ambulatory Visit: Payer: 59 | Admitting: Physician Assistant

## 2020-02-23 ENCOUNTER — Encounter: Payer: Self-pay | Admitting: Physician Assistant

## 2020-02-23 LAB — COMPREHENSIVE METABOLIC PANEL
ALT: 21 IU/L (ref 0–44)
AST: 23 IU/L (ref 0–40)
Albumin/Globulin Ratio: 1.8 (ref 1.2–2.2)
Albumin: 4.4 g/dL (ref 4.0–5.0)
Alkaline Phosphatase: 62 IU/L (ref 44–121)
BUN/Creatinine Ratio: 15 (ref 9–20)
BUN: 20 mg/dL (ref 6–24)
Bilirubin Total: 0.9 mg/dL (ref 0.0–1.2)
CO2: 22 mmol/L (ref 20–29)
Calcium: 9.7 mg/dL (ref 8.7–10.2)
Chloride: 102 mmol/L (ref 96–106)
Creatinine, Ser: 1.3 mg/dL — ABNORMAL HIGH (ref 0.76–1.27)
GFR calc Af Amer: 78 mL/min/{1.73_m2} (ref >59)
GFR calc non Af Amer: 68 mL/min/{1.73_m2} (ref >59)
Globulin, Total: 2.4 g/dL (ref 1.5–4.5)
Glucose: 91 mg/dL (ref 65–99)
Potassium: 4.4 mmol/L (ref 3.5–5.2)
Sodium: 140 mmol/L (ref 134–144)
Total Protein: 6.8 g/dL (ref 6.0–8.5)

## 2020-02-23 LAB — LIPID PANEL
Chol/HDL Ratio: 5 ratio (ref 0.0–5.0)
Cholesterol, Total: 198 mg/dL (ref 100–199)
HDL: 40 mg/dL (ref >39)
LDL Chol Calc (NIH): 142 mg/dL — ABNORMAL HIGH (ref 0–99)
Triglycerides: 86 mg/dL (ref 0–149)
VLDL Cholesterol Cal: 16 mg/dL (ref 5–40)

## 2020-02-23 LAB — HEPATITIS C ANTIBODY: Hep C Virus Ab: <0.1 s/co ratio (ref 0.0–0.9)

## 2020-03-16 IMAGING — CR CHEST - 2 VIEW
1 series · 2 of 2 positions shown · non-contrast
Comparison: Radiograph 08/05/2015

CLINICAL DATA: Cough for 6 months. Moderate persistent extrinsic
asthma.

EXAM:
CHEST - 2 VIEW

[Series 1: dg chest 2 view · 0.14mm/px · 2 of 2 slices shown]
[im 1/2]
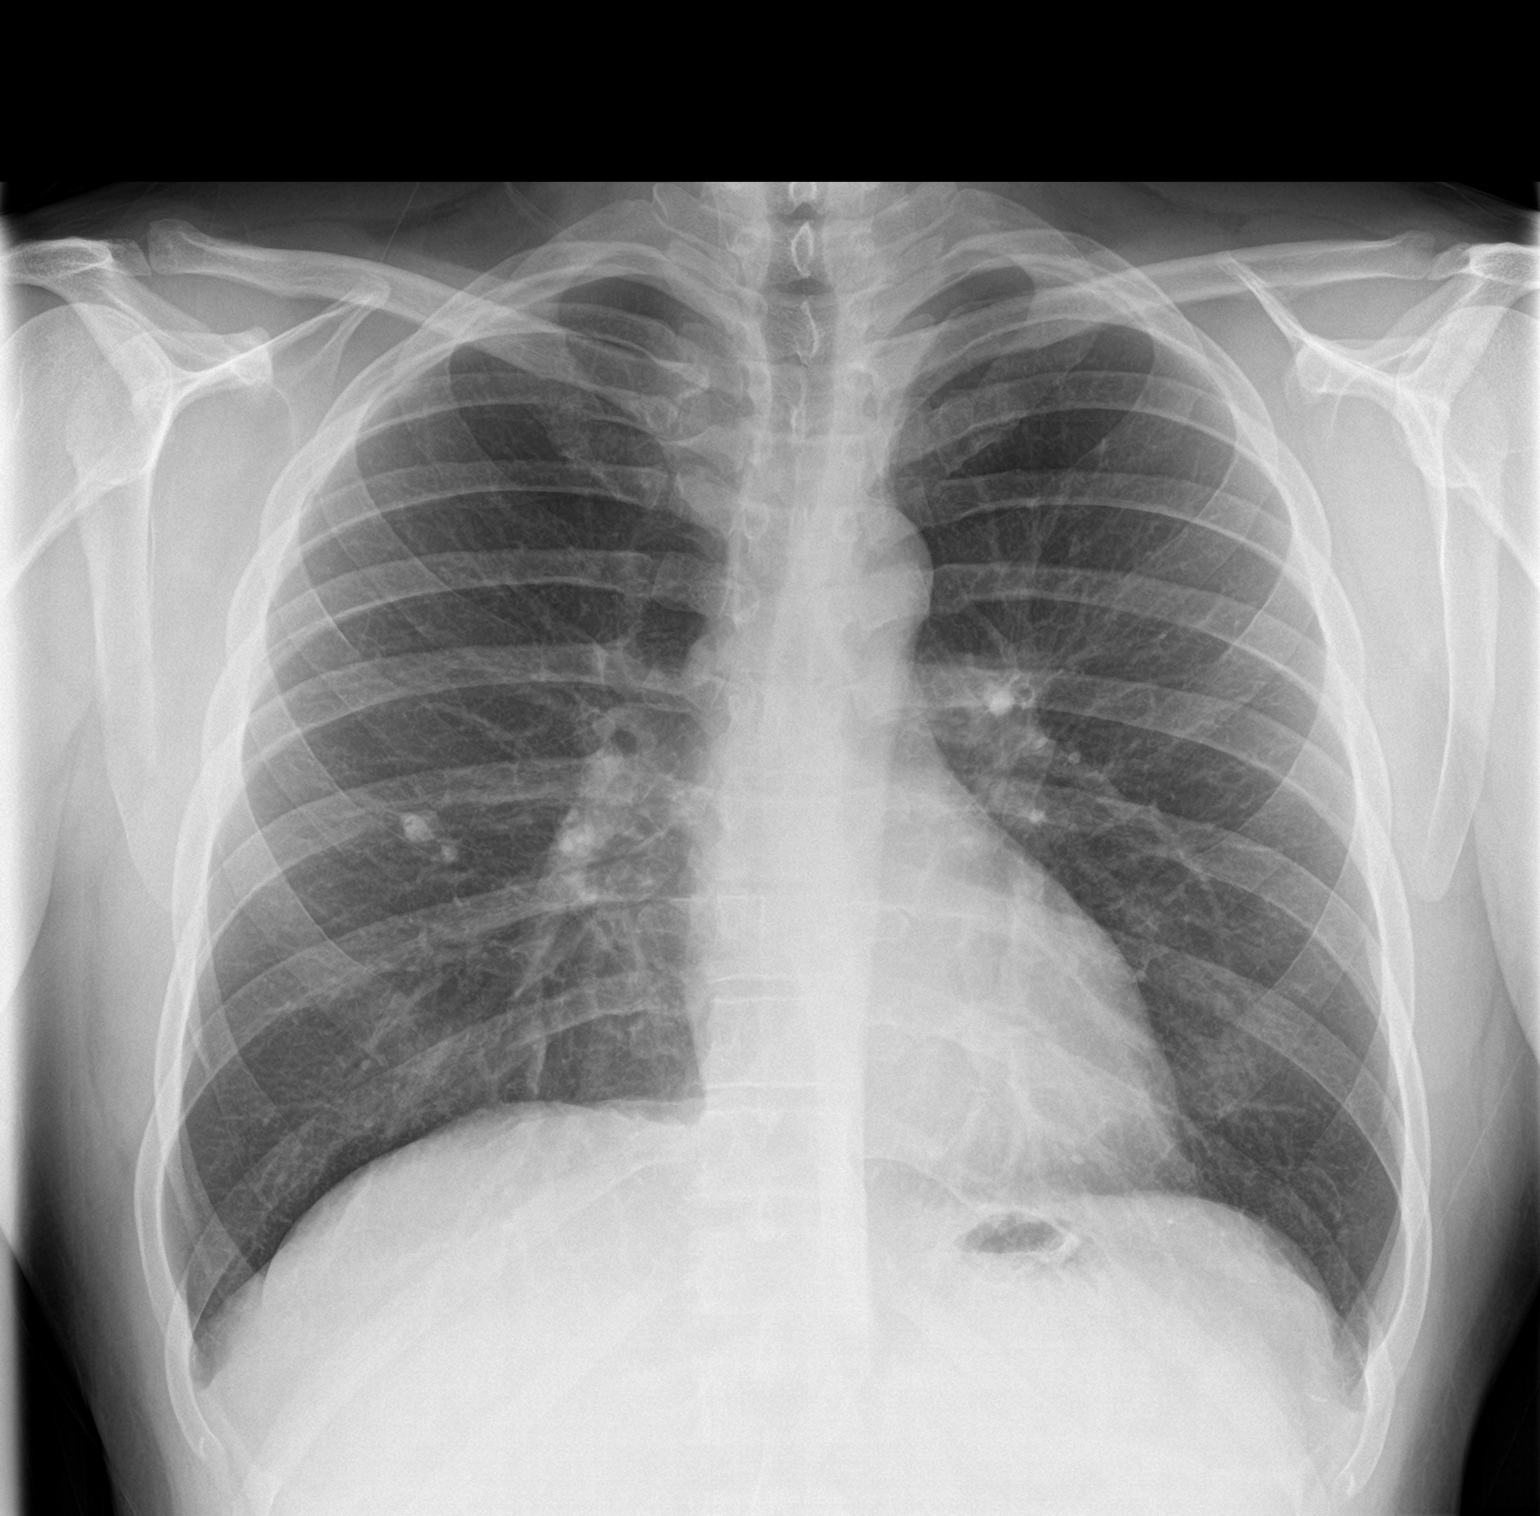
[im 2/2]
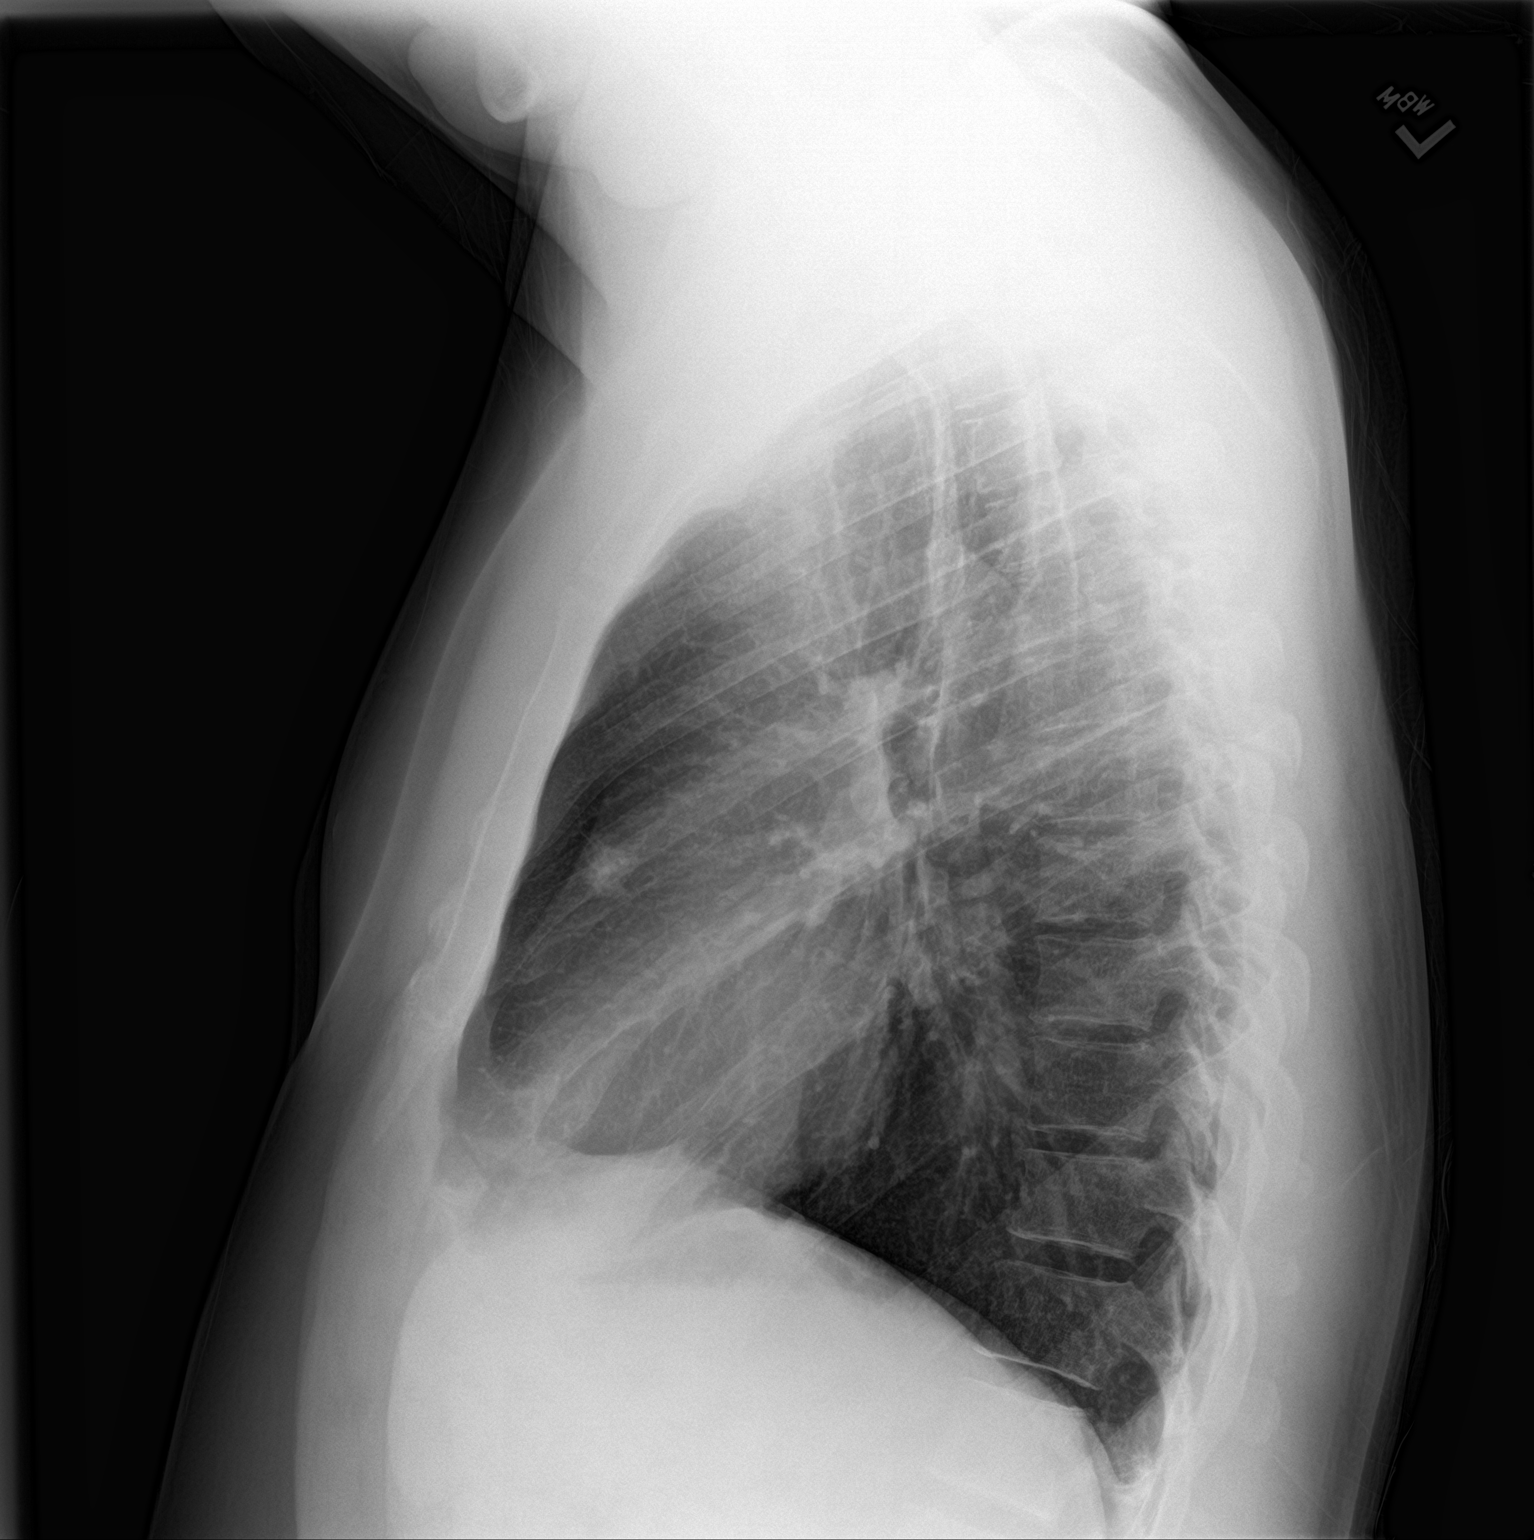

[2 of 2 positions shown; findings below may reference images not displayed]

FINDINGS: The cardiomediastinal contours are normal. Right midlung nodule may
be calcified, unchanged from prior exam and likely granuloma.
Pulmonary vasculature is normal. No consolidation, pleural effusion,
or pneumothorax. No acute osseous abnormalities are seen.
IMPRESSION: 1. No acute chest findings.
2. Stable right midlung nodule likely calcified granuloma.

## 2021-02-14 NOTE — Progress Notes (Deleted)
Complete physical exam   Patient: Ian Harper   DOB: September 03, 1980   40 y.o. Male  MRN: [id number] Visit Date: 02/20/2021  Today's healthcare provider: Shirlee Latch, MD   No chief complaint on file.  Subjective    Ian Harper is a 40 y.o. male who presents today for a complete physical exam.  He reports consuming a {diet types:17450} diet. {Exercise:19826} He generally feels {well/fairly well/poorly:18703}. He reports sleeping {well/fairly well/poorly:18703}. He {does/does not:200015} have additional problems to discuss today.  HPI  ***  Past Medical History:  Diagnosis Date   Allergy    Anxiety    Mitral valve prolapse    Past Surgical History:  Procedure Laterality Date   APPENDECTOMY     Social History   Socioeconomic History   Marital status: Married    Spouse name: Not on file   Number of children: 3   Years of education: Not on file   Highest education level: Not on file  Occupational History   Occupation: SAHD    Comment: looking for teaching job  Tobacco Use   Smoking status: Never   Smokeless tobacco: Never  Vaping Use   Vaping Use: Never used  Substance and Sexual Activity   Alcohol use: Yes    Alcohol/week: 3.0 standard drinks    Types: 3 Cans of beer per week   Drug use: No   Sexual activity: Yes    Partners: Female  Other Topics Concern   Not on file  Social History Narrative   Not on file   Social Determinants of Health   Financial Resource Strain: Not on file  Food Insecurity: Not on file  Transportation Needs: Not on file  Physical Activity: Not on file  Stress: Not on file  Social Connections: Not on file  Intimate Partner Violence: Not on file   Family Status  Relation Name Status   Mother  Alive   Father  Alive   Sister  (Not Specified)   MGM  (Not Specified)   MGF  (Not Specified)   PGM  (Not Specified)   Mat Uncle  (Not Specified)   Family History  Problem Relation Age of Onset   Breast cancer Mother 67    Stroke Father 34   Hyperlipidemia Father    Diabetes Sister        type 2, obesity   Lung cancer Maternal Grandmother    Stroke Maternal Grandfather    Diabetes Maternal Grandfather    Stroke Paternal Grandmother    Hyperlipidemia Paternal Grandmother    Diabetes Maternal Uncle    No Known Allergies  Patient Care Team: Erasmo Downer, MD as PCP - General (Family Medicine)   Medications: Outpatient Medications Prior to Visit  Medication Sig   busPIRone (BUSPAR) 5 MG tablet Take 1 tablet (5 mg total) by mouth 3 (three) times daily.   No facility-administered medications prior to visit.    Review of Systems  All other systems reviewed and are negative.  Last CBC Lab Results  Component Value Date   WBC 6.7 12/05/2018   HGB 15.7 12/05/2018   HCT 47.0 12/05/2018   MCV 89 12/05/2018   MCH 29.7 12/05/2018   RDW 13.0 12/05/2018   PLT 244 12/05/2018   Last metabolic panel Lab Results  Component Value Date   GLUCOSE 91 02/22/2020   NA 140 02/22/2020   K 4.4 02/22/2020   CL 102 02/22/2020   CO2 22 02/22/2020   BUN 20 02/22/2020  CREATININE 1.30 (H) 02/22/2020   GFRNONAA 68 02/22/2020   CALCIUM 9.7 02/22/2020   PROT 6.8 02/22/2020   ALBUMIN 4.4 02/22/2020   LABGLOB 2.4 02/22/2020   AGRATIO 1.8 02/22/2020   BILITOT 0.9 02/22/2020   ALKPHOS 62 02/22/2020   AST 23 02/22/2020   ALT 21 02/22/2020   ANIONGAP 6 04/25/2015   Last lipids Lab Results  Component Value Date   CHOL 198 02/22/2020   HDL 40 02/22/2020   LDLCALC 142 (H) 02/22/2020   TRIG 86 02/22/2020   CHOLHDL 5.0 02/22/2020   Last hemoglobin A1c Lab Results  Component Value Date   HGBA1C 5.2 10/29/2017   Last thyroid functions Lab Results  Component Value Date   TSH 2.090 08/18/2019      Objective    There were no vitals taken for this visit. BP Readings from Last 3 Encounters:  02/20/20 123/76  08/18/19 106/72  02/24/19 129/79   Wt Readings from Last 3 Encounters:  02/20/20 197 lb  (89.4 kg)  08/18/19 195 lb 12.8 oz (88.8 kg)  02/24/19 195 lb (88.5 kg)      Physical Exam  ***  Last depression screening scores PHQ 2/9 Scores 02/20/2020 12/12/2018 07/07/2018  PHQ - 2 Score 2 3 4   PHQ- 9 Score 5 4 7    Last fall risk screening Fall Risk  02/20/2020  Falls in the past year? 0  Number falls in past yr: 0  Injury with Fall? 0  Risk for fall due to : No Fall Risks  Follow up Falls evaluation completed   Last Audit-C alcohol use screening Alcohol Use Disorder Test (AUDIT) 02/20/2020  1. How often do you have a drink containing alcohol? 2  2. How many drinks containing alcohol do you have on a typical day when you are drinking? 0  3. How often do you have six or more drinks on one occasion? 0  AUDIT-C Score 2  Alcohol Brief Interventions/Follow-up AUDIT Score <7 follow-up not indicated   A score of 3 or more in women, and 4 or more in men indicates increased risk for alcohol abuse, EXCEPT if all of the points are from question 1   No results found for any visits on 02/20/21.  Assessment & Plan    Routine Health Maintenance and Physical Exam  Exercise Activities and Dietary recommendations  Goals   None     Immunization History  Administered Date(s) Administered   Hepatitis B 11/03/2013, 12/04/2013, 05/07/2014   Influenza-Unspecified 02/04/2017, 10/28/2017   PFIZER(Purple Top)SARS-COV-2 Vaccination 05/05/2019, 05/26/2019   Td 03/10/2017   Tdap 10/29/2009    Health Maintenance  Topic Date Due   Pneumococcal Vaccine 82-74 Years old (1 - PCV) Never done   COVID-19 Vaccine (3 - Booster for Pfizer series) 07/21/2019   INFLUENZA VACCINE  09/16/2020   TETANUS/TDAP  03/11/2027   Hepatitis C Screening  Completed   HIV Screening  Completed   HPV VACCINES  Aged Out    Discussed health benefits of physical activity, and encouraged him to engage in regular exercise appropriate for his age and condition.  ***  No follow-ups on file.     {provider  attestation***:1}   11/16/2020, MD  [facility] [phone] (phone) [phone] (fax)  [facility]

## 2021-02-20 ENCOUNTER — Encounter: Payer: 59 | Admitting: Family Medicine

## 2021-04-01 ENCOUNTER — Ambulatory Visit: Payer: Self-pay | Admitting: Family Medicine

## 2021-04-02 ENCOUNTER — Ambulatory Visit (LOCAL_COMMUNITY_HEALTH_CENTER): Payer: 59

## 2021-04-02 ENCOUNTER — Other Ambulatory Visit: Payer: Self-pay

## 2021-04-02 DIAGNOSIS — Z23 Encounter for immunization: Secondary | ICD-10-CM | POA: Diagnosis not present

## 2021-04-02 DIAGNOSIS — Z719 Counseling, unspecified: Secondary | ICD-10-CM

## 2021-04-02 NOTE — Progress Notes (Signed)
Patient in nurse clinic for MMR immunizations for College. Patient states he cannot locate vaccine record and cannot remember if he had received previous doses. MMR administered and patient tolerated well. [facility] updated and copy given to patient. Recommended follow up schedule explained and patient verbalized understanding. Ranae Palms, RN

## 2021-04-30 ENCOUNTER — Other Ambulatory Visit: Payer: Self-pay

## 2021-04-30 ENCOUNTER — Ambulatory Visit (LOCAL_COMMUNITY_HEALTH_CENTER): Payer: 59

## 2021-04-30 DIAGNOSIS — Z719 Counseling, unspecified: Secondary | ICD-10-CM

## 2021-04-30 DIAGNOSIS — Z23 Encounter for immunization: Secondary | ICD-10-CM

## 2021-04-30 NOTE — Progress Notes (Signed)
Patient in nurse clinic for MMR 2nd dose. Vaccine administered and patient tolerated well. [facility] updated and copy given to patient. Ranae Palms, RN ? ?

## 2021-05-08 NOTE — Progress Notes (Signed)
I,Ian Harper,acting as a Neurosurgeon for Shirlee Latch, MD.,have documented all relevant documentation on the behalf of Shirlee Latch, MD,as directed by  Shirlee Latch, MD while in the presence of Shirlee Latch, MD.   Complete physical exam   Patient: Ian Harper   DOB: 08-28-1980   41 y.o. Male  MRN: [id number] Visit Date: 05/09/2021  Today's healthcare provider: Shirlee Latch, MD   Chief Complaint  Patient presents with   Annual Exam   Subjective    Ian Harper is a 41 y.o. male who presents today for a complete physical exam.  He reports consuming a general diet. Gym/ health club routine includes mod to heavy weightlifting. He generally feels well. He reports sleeping well. He does have additional problems to discuss today.  HPI  Patient C/O plantar warts, would like a referral.  Has had them for a long time. Only on L foot. Painful.   Patient concerned about decreased concentration in class. Patient also reports fatigue. Doesn't have as much energy. No trouble with erections, muscle mass loss.  C/O left arm pain around elbow especially with exercise. Worse after chin ups  Past Medical History:  Diagnosis Date   Allergy    Anxiety    Mitral valve prolapse    Past Surgical History:  Procedure Laterality Date   APPENDECTOMY     Social History   Socioeconomic History   Marital status: Married    Spouse name: Not on file   Number of children: 3   Years of education: Not on file   Highest education level: Not on file  Occupational History   Occupation: SAHD    Comment: looking for teaching job  Tobacco Use   Smoking status: Never   Smokeless tobacco: Never  Vaping Use   Vaping Use: Never used  Substance and Sexual Activity   Alcohol use: Yes    Alcohol/week: 3.0 standard drinks    Types: 3 Cans of beer per week   Drug use: No   Sexual activity: Yes    Partners: Female  Other Topics Concern   Not on file  Social History Narrative    Not on file   Social Determinants of Health   Financial Resource Strain: Not on file  Food Insecurity: Not on file  Transportation Needs: Not on file  Physical Activity: Not on file  Stress: Not on file  Social Connections: Not on file  Intimate Partner Violence: Not on file   Family Status  Relation Name Status   Mother  Alive   Father  Alive   Sister  (Not Specified)   MGM  (Not Specified)   MGF  (Not Specified)   PGM  (Not Specified)   Mat Uncle  (Not Specified)   Family History  Problem Relation Age of Onset   Breast cancer Mother 18   Stroke Father 70   Hyperlipidemia Father    Diabetes Sister        type 2, obesity   Lung cancer Maternal Grandmother    Stroke Maternal Grandfather    Diabetes Maternal Grandfather    Stroke Paternal Grandmother    Hyperlipidemia Paternal Grandmother    Diabetes Maternal Uncle    No Known Allergies  Patient Care Team: Erasmo Downer, MD as PCP - General (Family Medicine)   Medications: Outpatient Medications Prior to Visit  Medication Sig   [DISCONTINUED] busPIRone (BUSPAR) 5 MG tablet Take 1 tablet (5 mg total) by mouth 3 (three) times daily.  No facility-administered medications prior to visit.    Review of Systems  Psychiatric/Behavioral:  Positive for decreased concentration.   All other systems reviewed and are negative.  Last CBC Lab Results  Component Value Date   WBC 6.7 12/05/2018   HGB 15.7 12/05/2018   HCT 47.0 12/05/2018   MCV 89 12/05/2018   MCH 29.7 12/05/2018   RDW 13.0 12/05/2018   PLT 244 12/05/2018   Last metabolic panel Lab Results  Component Value Date   GLUCOSE 91 02/22/2020   NA 140 02/22/2020   K 4.4 02/22/2020   CL 102 02/22/2020   CO2 22 02/22/2020   BUN 20 02/22/2020   CREATININE 1.30 (H) 02/22/2020   GFRNONAA 68 02/22/2020   CALCIUM 9.7 02/22/2020   PROT 6.8 02/22/2020   ALBUMIN 4.4 02/22/2020   LABGLOB 2.4 02/22/2020   AGRATIO 1.8 02/22/2020   BILITOT 0.9 02/22/2020    ALKPHOS 62 02/22/2020   AST 23 02/22/2020   ALT 21 02/22/2020   ANIONGAP 6 04/25/2015   Last lipids Lab Results  Component Value Date   CHOL 198 02/22/2020   HDL 40 02/22/2020   LDLCALC 142 (H) 02/22/2020   TRIG 86 02/22/2020   CHOLHDL 5.0 02/22/2020   Last hemoglobin A1c Lab Results  Component Value Date   HGBA1C 5.2 10/29/2017   Last thyroid functions Lab Results  Component Value Date   TSH 2.090 08/18/2019      Objective    BP 132/83 (BP Location: Left Arm, Patient Position: Sitting, Cuff Size: Large)   Pulse 70   Temp 97.8 F (36.6 C) (Temporal)   Resp 16   Ht 5\' 9"  (1.753 m)   Wt 186 lb 12.8 oz (84.7 kg)   BMI 27.59 kg/m  BP Readings from Last 3 Encounters:  05/09/21 132/83  02/20/20 123/76  08/18/19 106/72   Wt Readings from Last 3 Encounters:  05/09/21 186 lb 12.8 oz (84.7 kg)  02/20/20 197 lb (89.4 kg)  08/18/19 195 lb 12.8 oz (88.8 kg)       Physical Exam Vitals reviewed.  Constitutional:      General: He is not in acute distress.    Appearance: Normal appearance. He is well-developed. He is not diaphoretic.  HENT:     Head: Normocephalic and atraumatic.     Right Ear: Tympanic membrane, ear canal and external ear normal.     Left Ear: Tympanic membrane, ear canal and external ear normal.     Nose: Nose normal.     Mouth/Throat:     Mouth: Mucous membranes are moist.     Pharynx: Oropharynx is clear. No oropharyngeal exudate.  Eyes:     General: No scleral icterus.    Conjunctiva/sclera: Conjunctivae normal.     Pupils: Pupils are equal, round, and reactive to light.  Neck:     Thyroid: No thyromegaly.  Cardiovascular:     Rate and Rhythm: Normal rate and regular rhythm.     Pulses: Normal pulses.     Heart sounds: Normal heart sounds. No murmur heard. Pulmonary:     Effort: Pulmonary effort is normal. No respiratory distress.     Breath sounds: Normal breath sounds. No wheezing or rales.  Abdominal:     General: There is no  distension.     Palpations: Abdomen is soft.     Tenderness: There is no abdominal tenderness.  Musculoskeletal:        General: No deformity.     Cervical back: Neck  supple.     Right lower leg: No edema.     Left lower leg: No edema.     Comments: TTP over L medial epicondyle  Lymphadenopathy:     Cervical: No cervical adenopathy.  Skin:    General: Skin is warm and dry.     Findings: No rash.  Neurological:     Mental Status: He is alert and oriented to person, place, and time. Mental status is at baseline.     Gait: Gait normal.  Psychiatric:        Mood and Affect: Mood normal.        Behavior: Behavior normal.        Thought Content: Thought content normal.    Wart on L forearm  Last depression screening scores    05/09/2021    8:15 AM 02/20/2020   11:58 AM 12/12/2018   10:04 AM  PHQ 2/9 Scores  PHQ - 2 Score 0 2 3  PHQ- 9 Score 2 5 4    Last fall risk screening    05/09/2021    8:16 AM  Fall Risk   Falls in the past year? 0  Number falls in past yr: 0  Injury with Fall? 0  Risk for fall due to : No Fall Risks  Follow up Falls evaluation completed   Last Audit-C alcohol use screening    05/09/2021    8:15 AM  Alcohol Use Disorder Test (AUDIT)  1. How often do you have a drink containing alcohol? 3  2. How many drinks containing alcohol do you have on a typical day when you are drinking? 0  3. How often do you have six or more drinks on one occasion? 0  AUDIT-C Score 3   A score of 3 or more in women, and 4 or more in men indicates increased risk for alcohol abuse, EXCEPT if all of the points are from question 1   No results found for any visits on 05/09/21.  Assessment & Plan    Routine Health Maintenance and Physical Exam  Exercise Activities and Dietary recommendations  Goals   None     Immunization History  Administered Date(s) Administered   Hepatitis B 11/03/2013, 12/04/2013, 05/07/2014   Influenza-Unspecified 02/04/2017, 10/28/2017   MMR  04/02/2021, 04/30/2021   PFIZER(Purple Top)SARS-COV-2 Vaccination 05/05/2019, 05/26/2019   Td 03/10/2017   Tdap 10/29/2009    Health Maintenance  Topic Date Due   COVID-19 Vaccine (3 - Booster for Pfizer series) 07/21/2019   INFLUENZA VACCINE  05/16/2021 (Originally 09/16/2020)   TETANUS/TDAP  03/11/2027   Hepatitis C Screening  Completed   HIV Screening  Completed   HPV VACCINES  Aged Out    Discussed health benefits of physical activity, and encouraged him to engage in regular exercise appropriate for his age and condition.  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      Other viral warts    Encourage home treatment with compound W Can use cryotherapy if not resolving      Concentration deficit    Doubt ADHD as it is new and not affecting him in other aspects of life other than in class He is still doing well in the class also Will not pursue testing at this time - joint decision with patient      Fatigue    New problem Likely multifactorial No depression/anxiety Discussed that doubt low testosterone given lack of other symptoms Check labs for possible underlying causes      Relevant Orders   Comprehensive metabolic panel   CBC   TSH   B12   VITAMIN D 25 Hydroxy (Vit-D Deficiency, Fractures)   Other Visit Diagnoses     Encounter for annual physical exam    -  Primary   Relevant Orders    Comprehensive metabolic panel   Lipid Panel With LDL/HDL Ratio   CBC   TSH   B12   VITAMIN D 25 Hydroxy (Vit-D Deficiency, Fractures)        Return in about 1 year (around 05/10/2022) for CPE.     I, Shirlee Latch, MD, have reviewed all documentation for this visit. The documentation on 05/09/21 for the exam, diagnosis, procedures, and orders are all accurate and complete.   Twinkle Sockwell, Marzella Schlein, MD, MPH [facility]

## 2021-05-09 ENCOUNTER — Ambulatory Visit (INDEPENDENT_AMBULATORY_CARE_PROVIDER_SITE_OTHER): Payer: 59 | Admitting: Family Medicine

## 2021-05-09 ENCOUNTER — Encounter: Payer: Self-pay | Admitting: Family Medicine

## 2021-05-09 ENCOUNTER — Other Ambulatory Visit: Payer: Self-pay

## 2021-05-09 VITALS — BP 132/83 | HR 70 | Temp 97.8°F | Resp 16 | Ht 69.0 in | Wt 186.8 lb

## 2021-05-09 DIAGNOSIS — E663 Overweight: Secondary | ICD-10-CM | POA: Diagnosis not present

## 2021-05-09 DIAGNOSIS — Z0000 Encounter for general adult medical examination without abnormal findings: Secondary | ICD-10-CM | POA: Diagnosis not present

## 2021-05-09 DIAGNOSIS — B078 Other viral warts: Secondary | ICD-10-CM | POA: Diagnosis not present

## 2021-05-09 DIAGNOSIS — F411 Generalized anxiety disorder: Secondary | ICD-10-CM | POA: Diagnosis not present

## 2021-05-09 DIAGNOSIS — M7702 Medial epicondylitis, left elbow: Secondary | ICD-10-CM | POA: Insufficient documentation

## 2021-05-09 DIAGNOSIS — B070 Plantar wart: Secondary | ICD-10-CM

## 2021-05-09 DIAGNOSIS — R5383 Other fatigue: Secondary | ICD-10-CM

## 2021-05-09 DIAGNOSIS — R41840 Attention and concentration deficit: Secondary | ICD-10-CM

## 2021-05-09 DIAGNOSIS — R69 Illness, unspecified: Secondary | ICD-10-CM | POA: Diagnosis not present

## 2021-05-09 NOTE — Patient Instructions (Signed)
Ice, tennis elbow band, voltaren gel for the golfer's elbow ? ?Wart - buff, compound W, duct tape ?

## 2021-05-09 NOTE — Assessment & Plan Note (Signed)
New problem ?Likely multifactorial ?No depression/anxiety ?Discussed that doubt low testosterone given lack of other symptoms ?Check labs for possible underlying causes ?

## 2021-05-09 NOTE — Assessment & Plan Note (Signed)
Doubt ADHD as it is new and not affecting him in other aspects of life other than in class ?He is still doing well in the class also ?Will not pursue testing at this time - joint decision with patient ?

## 2021-05-09 NOTE — Assessment & Plan Note (Signed)
Encourage home treatment with compound W ?Can use cryotherapy if not resolving ?

## 2021-05-09 NOTE — Assessment & Plan Note (Signed)
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

## 2021-05-09 NOTE — Assessment & Plan Note (Signed)
Longstanding ?Now becoming painful ?Will send to podiatry for possible excision ?

## 2021-05-09 NOTE — Assessment & Plan Note (Signed)
New problem ?Discussed rest, ice, brace ?Voltaren gel prn ?HEP ?Consider formal PT if not improving ?

## 2021-05-10 LAB — COMPREHENSIVE METABOLIC PANEL
ALT: 27 IU/L (ref 0–44)
AST: 22 IU/L (ref 0–40)
Albumin/Globulin Ratio: 2 (ref 1.2–2.2)
Albumin: 4.7 g/dL (ref 4.0–5.0)
Alkaline Phosphatase: 67 IU/L (ref 44–121)
BUN/Creatinine Ratio: 18 (ref 9–20)
BUN: 21 mg/dL (ref 6–24)
Bilirubin Total: 1.2 mg/dL (ref 0.0–1.2)
CO2: 25 mmol/L (ref 20–29)
Calcium: 9.9 mg/dL (ref 8.7–10.2)
Chloride: 101 mmol/L (ref 96–106)
Creatinine, Ser: 1.15 mg/dL (ref 0.76–1.27)
Globulin, Total: 2.4 g/dL (ref 1.5–4.5)
Glucose: 84 mg/dL (ref 70–99)
Potassium: 4 mmol/L (ref 3.5–5.2)
Sodium: 140 mmol/L (ref 134–144)
Total Protein: 7.1 g/dL (ref 6.0–8.5)
eGFR: 81 mL/min/{1.73_m2} (ref >59)

## 2021-05-10 LAB — CBC
Hematocrit: 47.7 % (ref 37.5–51.0)
Hemoglobin: 16.1 g/dL (ref 13.0–17.7)
MCH: 30 pg (ref 26.6–33.0)
MCHC: 33.8 g/dL (ref 31.5–35.7)
MCV: 89 fL (ref 79–97)
Platelets: 240 10*3/uL (ref 150–450)
RBC: 5.36 x10E6/uL (ref 4.14–5.80)
RDW: 12.6 % (ref 11.6–15.4)
WBC: 6.5 10*3/uL (ref 3.4–10.8)

## 2021-05-10 LAB — LIPID PANEL WITH LDL/HDL RATIO
Cholesterol, Total: 194 mg/dL (ref 100–199)
HDL: 51 mg/dL (ref >39)
LDL Chol Calc (NIH): 128 mg/dL — ABNORMAL HIGH (ref 0–99)
LDL/HDL Ratio: 2.5 ratio (ref 0.0–3.6)
Triglycerides: 84 mg/dL (ref 0–149)
VLDL Cholesterol Cal: 15 mg/dL (ref 5–40)

## 2021-05-10 LAB — VITAMIN B12: Vitamin B-12: 649 pg/mL (ref 232–1245)

## 2021-05-10 LAB — VITAMIN D 25 HYDROXY (VIT D DEFICIENCY, FRACTURES): Vit D, 25-Hydroxy: 23.8 ng/mL — ABNORMAL LOW (ref 30.0–100.0)

## 2021-05-10 LAB — TSH: TSH: 2.52 u[IU]/mL (ref 0.450–4.500)

## 2021-05-13 ENCOUNTER — Encounter: Payer: Self-pay | Admitting: Podiatry

## 2021-05-13 ENCOUNTER — Ambulatory Visit: Payer: 59 | Admitting: Podiatry

## 2021-05-13 ENCOUNTER — Other Ambulatory Visit: Payer: Self-pay

## 2021-05-13 DIAGNOSIS — Q828 Other specified congenital malformations of skin: Secondary | ICD-10-CM

## 2021-05-13 DIAGNOSIS — M21961 Unspecified acquired deformity of right lower leg: Secondary | ICD-10-CM

## 2021-05-13 DIAGNOSIS — M21962 Unspecified acquired deformity of left lower leg: Secondary | ICD-10-CM | POA: Diagnosis not present

## 2021-05-13 DIAGNOSIS — Q666 Other congenital valgus deformities of feet: Secondary | ICD-10-CM | POA: Diagnosis not present

## 2021-05-13 NOTE — Progress Notes (Signed)
?  Subjective:  ?Patient ID: Ian Harper, male    DOB: 1980-10-10,  MRN: [id number] ? ?Chief Complaint  ?Patient presents with  ? Plantar Warts  ? ? ?41 y.o. male presents with the above complaint.  Patient presents with complaint of left heel porokeratotic lesion.  Patient states painful to touch painful to walk on.  Has progressive gotten worse.  He wanted to get it evaluated.  He states that it hurts with ambulation pain scale 7 out of 10.  Sharp shooting in nature.  They have been present for a few months has progressive gotten worse.  He has not seen anyone else prior to seeing me.  He also has secondary complaint of flatfoot.  He does not wear any orthotics.  He denies any other acute issues. ? ? ?Review of Systems: Negative except as noted in the HPI. Denies N/V/F/Ch. ? ?Past Medical History:  ?Diagnosis Date  ? Allergy   ? Anxiety   ? Mitral valve prolapse   ? ?No current outpatient medications on file. ? ?Social History  ? ?Tobacco Use  ?Smoking Status Never  ?Smokeless Tobacco Never  ? ? ?No Known Allergies ?Objective:  ?There were no vitals filed for this visit. ?There is no height or weight on file to calculate BMI. ?Constitutional Well developed. ?Well nourished.  ?Vascular Dorsalis pedis pulses palpable bilaterally. ?Posterior tibial pulses palpable bilaterally. ?Capillary refill normal to all digits.  ?No cyanosis or clubbing noted. ?Pedal hair growth normal.  ?Neurologic Normal speech. ?Oriented to person, place, and time. ?Epicritic sensation to light touch grossly present bilaterally.  ?Dermatologic Hyperkeratotic lesion with central nucleated core noted to left heel x3.  Pain on palpation to the lesion.  Gait examination shows pes planovalgus structure partially able to recruit the arch with dorsiflexion of the hallux.  Unable to form perform single and double heel raise.    ?Orthopedic: Normal joint ROM without pain or crepitus bilaterally. ?No visible deformities. ?No bony tenderness.   ? ?Radiographs: None ?Assessment:  ? ?1. Deformity of both feet   ?2. Pes planovalgus   ?3. Porokeratosis   ? ?Plan:  ?Patient was evaluated and treated and all questions answered. ? ?Left heel porokeratotic lesion ?-All questions and concerns were discussed with the patient in extensive detail ?-Using chisel blade handle the lesions were debrided down to healthy striated tissue.  No complication noted no pinpoint bleeding noted. ? ?Pes planovalgus/foot deformity ?-I explained to patient the etiology of pes planovalgus and relationship with arch pain and various treatment options were discussed.  Given patient foot structure in the setting of arch pain I believe patient will benefit from custom-made orthotics to help control the hindfoot motion support the arch of the foot and take the stress away from plantar fascial.  Patient agrees with the plan like to proceed with orthotics ?-Patient was casted for orthotics ? ? ?No follow-ups on file. ?

## 2021-07-08 ENCOUNTER — Ambulatory Visit: Payer: 59 | Admitting: Podiatry

## 2021-08-01 NOTE — Progress Notes (Unsigned)
I,Sulibeya S Dimas,acting as a Education administrator for Lavon Paganini, MD.,have documented all relevant documentation on the behalf of Lavon Paganini, MD,as directed by  Lavon Paganini, MD while in the presence of Lavon Paganini, MD.   Established patient visit   Patient: Ian Harper   DOB: Jul 04, 1980   41 y.o. 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 Patient had a physical done 05/09/2021.   Patient C/O being "gassy" and sharp pain on right upper abdominal pain with deep breaths. Has had it before, but usually comes and goes. More frequent over last few days.  No postprandial pain.  Medications: No outpatient medications prior to visit.   No facility-administered medications prior to visit.    Review of Systems  Constitutional:  Negative for appetite change, chills, fever and unexpected weight change.  Respiratory:  Negative for shortness of breath.   Cardiovascular:  Negative for chest pain and palpitations.  Gastrointestinal:  Positive for abdominal distention, abdominal pain and vomiting. Negative for blood in stool, constipation, diarrhea and nausea.  Genitourinary:  Negative for hematuria.    Last CBC Lab Results  Component Value Date   WBC 6.5 05/09/2021   HGB 16.1 05/09/2021   HCT 47.7 05/09/2021   MCV 89 05/09/2021   MCH 30.0 05/09/2021   RDW 12.6 05/09/2021   PLT 240 83/41/9622   Last metabolic panel Lab Results  Component Value Date   GLUCOSE 84 05/09/2021   NA 140 05/09/2021   K 4.0 05/09/2021   CL 101 05/09/2021   CO2 25 05/09/2021   BUN 21 05/09/2021   CREATININE 1.15 05/09/2021   EGFR 81 05/09/2021   CALCIUM 9.9 05/09/2021   PROT 7.1 05/09/2021   ALBUMIN 4.7 05/09/2021   LABGLOB 2.4 05/09/2021   AGRATIO 2.0 05/09/2021    BILITOT 1.2 05/09/2021   ALKPHOS 67 05/09/2021   AST 22 05/09/2021   ALT 27 05/09/2021   ANIONGAP 6 04/25/2015   Last lipids Lab Results  Component Value Date   CHOL 194 05/09/2021   HDL 51 05/09/2021   LDLCALC 128 (H) 05/09/2021   TRIG 84 05/09/2021   CHOLHDL 5.0 02/22/2020   Last hemoglobin A1c Lab Results  Component Value Date   HGBA1C 5.2 10/29/2017   Last thyroid functions Lab Results  Component Value Date   TSH 2.520 05/09/2021   Last vitamin D Lab Results  Component Value Date   VD25OH 23.8 (L) 05/09/2021   Last vitamin B12 and Folate Lab Results  Component Value Date   VITAMINB12 649 05/09/2021       Objective    BP 119/79 (BP Location: Left Arm, Patient Position: Sitting, Cuff Size: Large)   Pulse 60   Temp 98.5 F (36.9 C) (Oral)   Resp 16   Ht '5\' 9"'  (1.753 m)   Wt 196 lb (88.9 kg)   BMI 28.94 kg/m  BP Readings from Last 3 Encounters:  08/04/21 119/79  05/09/21 132/83  02/20/20 123/76   Wt Readings from Last 3 Encounters:  08/04/21 196 lb (88.9 kg)  05/09/21 186 lb 12.8 oz (84.7 kg)  02/20/20 197 lb (89.4 kg)      Physical Exam Vitals reviewed.  Constitutional:      General: He is not in acute distress.  Appearance: Normal appearance. He is not diaphoretic.  HENT:     Head: Normocephalic and atraumatic.  Eyes:     General: No scleral icterus.    Conjunctiva/sclera: Conjunctivae normal.  Cardiovascular:     Rate and Rhythm: Normal rate and regular rhythm.     Pulses: Normal pulses.     Heart sounds: Normal heart sounds. No murmur heard. Pulmonary:     Effort: Pulmonary effort is normal. No respiratory distress.     Breath sounds: Normal breath sounds. No wheezing or rhonchi.  Abdominal:     General: There is no distension.     Palpations: Abdomen is soft.     Tenderness: There is no abdominal tenderness. There is no guarding or rebound. Negative signs include Murphy's sign.  Musculoskeletal:     Cervical back: Neck supple.      Right lower leg: No edema.     Left lower leg: No edema.  Lymphadenopathy:     Cervical: No cervical adenopathy.  Skin:    General: Skin is warm and dry.     Findings: No rash.  Neurological:     Mental Status: He is alert and oriented to person, place, and time. Mental status is at baseline.  Psychiatric:        Mood and Affect: Mood normal.        Behavior: Behavior normal.       No results found for any visits on 08/04/21.  Assessment & Plan     1. RUQ pain - new problem - benign exam - discussed gas X for gas pain - if develops postprandial pain, will get RUQ [place] to eval gallbladder  2. Screening for tuberculosis - form will be completed for work pending TB test results - QuantiFERON-TB Gold Plus   No follow-ups on file.      I, Lavon Paganini, MD, have reviewed all documentation for this visit. The documentation on 08/04/21 for the exam, diagnosis, procedures, and orders are all accurate and complete.   Bacigalupo, Dionne Bucy, MD, MPH [facility]

## 2021-08-04 ENCOUNTER — Encounter: Payer: Self-pay | Admitting: Family Medicine

## 2021-08-04 ENCOUNTER — Ambulatory Visit (INDEPENDENT_AMBULATORY_CARE_PROVIDER_SITE_OTHER): Payer: 59 | Admitting: Family Medicine

## 2021-08-04 VITALS — BP 119/79 | HR 60 | Temp 98.5°F | Resp 16 | Ht 69.0 in | Wt 196.0 lb

## 2021-08-04 DIAGNOSIS — R1011 Right upper quadrant pain: Secondary | ICD-10-CM | POA: Diagnosis not present

## 2021-08-04 DIAGNOSIS — Z111 Encounter for screening for respiratory tuberculosis: Secondary | ICD-10-CM | POA: Diagnosis not present

## 2021-08-07 LAB — QUANTIFERON-TB GOLD PLUS
QuantiFERON Mitogen Value: >10 IU/mL
QuantiFERON Nil Value: 0 IU/mL
QuantiFERON TB1 Ag Value: 0 IU/mL
QuantiFERON TB2 Ag Value: 0 IU/mL
QuantiFERON-TB Gold Plus: NEGATIVE

## 2021-12-22 NOTE — Progress Notes (Unsigned)
I,Ian Harper,acting as a Education administrator for Lavon Paganini, MD.,have documented all relevant documentation on the behalf of Lavon Paganini, MD,as directed by  Lavon Paganini, MD while in the presence of Lavon Paganini, MD.     Established patient visit   Patient: Ian Harper   DOB: 05/22/1980   41 y.o. Male  MRN: [id number] Visit Date: 12/23/2021  Today's healthcare provider: Lavon Paganini, MD   Chief Complaint  Patient presents with   Chest Pain   Subjective    Chest Pain  This is a new problem. The problem occurs constantly. The problem has been unchanged. The pain is present in the lateral region. The pain is moderate. The quality of the pain is described as sharp. The pain does not radiate. Pertinent negatives include no abdominal pain, back pain, cough, diaphoresis, dizziness, fever, malaise/fatigue, nausea, palpitations, shortness of breath or vomiting. The pain is aggravated by movement. He has tried nothing for the symptoms.    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 Doesn't even want to go to work.has anxiety about going back on Sundays. Affecting mood outside of the job too.feels burnt out.     11 /08/2021    8:13 AM 08/04/2021    2:42 PM 05/09/2021    8:15 AM 02/20/2020   11:58 AM 12/12/2018   10:04 AM  Depression screen PHQ 2/9  Decreased Interest 2 0 0 1 2  Down, Depressed, Hopeless 2 0 0 1 1  PHQ - 2 Score 4 0 0 2 3  Altered sleeping 1 0 0 1 1  Tired, decreased energy 2 1 1 1  0  Change in appetite 2 1 0 0 0  Feeling bad or failure about yourself  1 0 0 1 0  Trouble concentrating 3 0 1 0 0  Moving slowly or fidgety/restless 1 0 0 0 0  Suicidal thoughts 0 0 0  0  PHQ-9 Score 14 2 2 5 4   Difficult doing work/chores Very difficult Not  difficult at all Not difficult at all Somewhat difficult Not difficult at all    Medications: No outpatient medications prior to visit.   No facility-administered medications prior to visit.    Review of Systems  Constitutional:  Negative for diaphoresis, fatigue, fever and malaise/fatigue.  Respiratory:  Negative for cough, chest tightness and shortness of breath.   Cardiovascular:  Positive for chest pain. Negative for palpitations and leg swelling.  Gastrointestinal:  Negative for abdominal pain, nausea and vomiting.  Musculoskeletal:  Negative for back pain.  Neurological:  Negative for dizziness.       Objective    BP 117/78 (BP Location: Left Arm, Patient Position: Sitting, Cuff Size: Large)   Pulse 64   Temp 98.7 F (37.1 C) (Oral)   Resp 16   Wt 193 lb 11.2 oz (87.9 kg)   BMI 28.60 kg/m  BP Readings from Last 3 Encounters:  12/23/21 117/78  08/04/21 119/79  05/09/21 132/83   Wt Readings from Last 3 Encounters:  12/23/21 193 lb 11.2 oz (87.9 kg)  08/04/21 196 lb (88.9 kg)  05/09/21 186 lb 12.8 oz (84.7 kg)   Physical Exam Vitals reviewed.  Constitutional:      General: He is not in acute distress.    Appearance: Normal appearance. He  is not diaphoretic.  HENT:     Head: Normocephalic and atraumatic.  Eyes:     General: No scleral icterus.    Conjunctiva/sclera: Conjunctivae normal.  Cardiovascular:     Rate and Rhythm: Normal rate and regular rhythm.     Pulses: Normal pulses.     Heart sounds: Normal heart sounds. No murmur heard. Pulmonary:     Effort: Pulmonary effort is normal. No respiratory distress.     Breath sounds: Normal breath sounds. No wheezing or rhonchi.  Chest:     Chest wall: Tenderness (mild TTP along lower R costal margin) present.  Abdominal:     Palpations: Abdomen is soft.     Tenderness: There is no abdominal tenderness.  Musculoskeletal:     Cervical back: Neck supple.     Right lower leg: No edema.     Left lower leg: No  edema.  Lymphadenopathy:     Cervical: No cervical adenopathy.  Skin:    General: Skin is warm and dry.     Findings: No rash.  Neurological:     Mental Status: He is alert and oriented to person, place, and time. Mental status is at baseline.  Psychiatric:        Mood and Affect: Mood normal.        Behavior: Behavior normal.       1. Costochondritis - new problem - seems to have some costal margin tenderness, so will treat like costochondritis - mobic daily for 2-4 weeks - avoid overuse activities/rest - ice prn - no signs of gallbladder pathology or PNA - return precautions discussed  2. Adjustment disorder with mixed anxiety and depressed mood - new problem - related to work stress and burnout - does not have time for counseling, but supportive coworkers help - havign worsening depression and anxiety sytmptoms - Will Start Zoloft 50 mg daily Discussed potential side effects Discussed that it can take 6-8 weeks to reach full efficacy Contracted for safety - no SI/HI Discussed synergistic effects of medications and therapy   3. Avitaminosis D - not currently on supplement - recheck level and determine need for treatment - VITAMIN D 25 Hydroxy (Vit-D Deficiency, Fractures)  4. RUQ pain - has now resolved from 6/23 visit - no postprandial pain - is worried about bilirubin at upper limit of normal so will repeat today - Comprehensive metabolic panel   Return for CPE, as scheduled.      I, Lavon Paganini, MD, have reviewed all documentation for this visit. The documentation on 12/23/21 for the exam, diagnosis, procedures, and orders are all accurate and complete.   Bacigalupo, Dionne Bucy, MD, MPH [facility]

## 2021-12-23 ENCOUNTER — Encounter: Payer: Self-pay | Admitting: Family Medicine

## 2021-12-23 ENCOUNTER — Ambulatory Visit (INDEPENDENT_AMBULATORY_CARE_PROVIDER_SITE_OTHER): Payer: 59 | Admitting: Family Medicine

## 2021-12-23 VITALS — BP 117/78 | HR 64 | Temp 98.7°F | Resp 16 | Wt 193.7 lb

## 2021-12-23 DIAGNOSIS — F4323 Adjustment disorder with mixed anxiety and depressed mood: Secondary | ICD-10-CM | POA: Diagnosis not present

## 2021-12-23 DIAGNOSIS — R1011 Right upper quadrant pain: Secondary | ICD-10-CM | POA: Diagnosis not present

## 2021-12-23 DIAGNOSIS — M940 Chondrocostal junction syndrome [Tietze]: Secondary | ICD-10-CM

## 2021-12-23 DIAGNOSIS — E559 Vitamin D deficiency, unspecified: Secondary | ICD-10-CM | POA: Diagnosis not present

## 2021-12-23 DIAGNOSIS — R69 Illness, unspecified: Secondary | ICD-10-CM | POA: Diagnosis not present

## 2021-12-23 MED ORDER — SERTRALINE HCL 50 MG PO TABS: 50.0000 mg | ORAL_TABLET | Freq: Every day | ORAL | 3 refills | Status: DC

## 2021-12-23 MED ORDER — HYDROXYZINE HCL 10 MG PO TABS: 10.0000 mg | ORAL_TABLET | Freq: Three times a day (TID) | ORAL | 0 refills | Status: DC | PRN

## 2021-12-23 MED ORDER — MELOXICAM 15 MG PO TABS: 15.0000 mg | ORAL_TABLET | Freq: Every day | ORAL | 0 refills | Status: DC

## 2021-12-24 LAB — COMPREHENSIVE METABOLIC PANEL
ALT: 28 IU/L (ref 0–44)
AST: 23 IU/L (ref 0–40)
Albumin/Globulin Ratio: 2.1 (ref 1.2–2.2)
Albumin: 4.7 g/dL (ref 4.1–5.1)
Alkaline Phosphatase: 69 IU/L (ref 44–121)
BUN/Creatinine Ratio: 11 (ref 9–20)
BUN: 13 mg/dL (ref 6–24)
Bilirubin Total: 0.6 mg/dL (ref 0.0–1.2)
CO2: 26 mmol/L (ref 20–29)
Calcium: 9.8 mg/dL (ref 8.7–10.2)
Chloride: 102 mmol/L (ref 96–106)
Creatinine, Ser: 1.16 mg/dL (ref 0.76–1.27)
Globulin, Total: 2.2 g/dL (ref 1.5–4.5)
Glucose: 91 mg/dL (ref 70–99)
Potassium: 4.1 mmol/L (ref 3.5–5.2)
Sodium: 141 mmol/L (ref 134–144)
Total Protein: 6.9 g/dL (ref 6.0–8.5)
eGFR: 80 mL/min/{1.73_m2} (ref >59)

## 2021-12-24 LAB — VITAMIN D 25 HYDROXY (VIT D DEFICIENCY, FRACTURES): Vit D, 25-Hydroxy: 22.3 ng/mL — ABNORMAL LOW (ref 30.0–100.0)

## 2022-02-17 NOTE — Telephone Encounter (Signed)
Lmom to call back orthotics are currently in the [place] office.   Balance $438

## 2022-05-14 ENCOUNTER — Encounter: Payer: 59 | Admitting: Family Medicine

## 2022-08-14 NOTE — Progress Notes (Unsigned)
I,Jovie Swanner S Kirtis Challis,acting as a Neurosurgeon for Shirlee Latch, MD.,have documented all relevant documentation on the behalf of Shirlee Latch, MD,as directed by  Shirlee Latch, MD while in the presence of Shirlee Latch, MD.    Complete physical exam   Patient: Ian Harper   DOB: [birth year]   42 y.o. Male  MRN: [id number] Visit Date: 08/17/2022  Today's healthcare provider: Shirlee Latch, MD   Chief Complaint  Patient presents with   Annual Exam   Subjective    Anton Bicknese is a 42 y.o. male who presents today for a complete physical exam.  He reports consuming a general diet. Gym/ health club routine includes mod to heavy weightlifting. He generally feels fairly well. He reports sleeping well. He does have additional problems to discuss today.  HPI   Discussed the use of AI scribe software for clinical note transcription with the patient, who gave verbal consent to proceed.  History of Present Illness   The patient, a school teacher, presents for CPE with left ear pain of two to three months duration. The pain is worse in the morning and then subsides. They describe it as a soreness and pressure sensation, but deny any hearing abnormalities or echo-like sensations. They have not noticed any changes in hearing. They deny any other concerns or symptoms.        Past Medical History:  Diagnosis Date   Allergy    Anxiety    Mitral valve prolapse    Past Surgical History:  Procedure Laterality Date   APPENDECTOMY     Social History   Socioeconomic History   Marital status: Married    Spouse name: Not on file   Number of children: 3   Years of education: Not on file   Highest education level: Not on file  Occupational History   Occupation: SAHD    Comment: looking for teaching job  Tobacco Use   Smoking status: Never   Smokeless tobacco: Never  Vaping Use   Vaping Use: Never used  Substance and Sexual Activity   Alcohol use: Yes    Alcohol/week: 3.0  standard drinks of alcohol    Types: 3 Cans of beer per week   Drug use: No   Sexual activity: Yes    Partners: Female  Other Topics Concern   Not on file  Social History Narrative   Not on file   Social Determinants of Health   Financial Resource Strain: Not on file  Food Insecurity: Not on file  Transportation Needs: Not on file  Physical Activity: Not on file  Stress: Not on file  Social Connections: Not on file  Intimate Partner Violence: Not on file   Family Status  Relation Name Status   Mother Aurther Loft Alive   Father Thayer Alive   Sister Chief of Staff (Not Specified)   MGM Morrell Riddle (Not Specified)   MGF Joe (Not Specified)   PGM [place] (Not Specified)   Mat Uncle Darin (Not Specified)   PGF Demarian (Not Specified)   Family History  Problem Relation Age of Onset   Breast cancer Mother 33   Anxiety disorder Mother    Cancer Mother    Stroke Father 46   Hyperlipidemia Father    Anxiety disorder Father    Diabetes Sister        type 2, obesity   Anxiety disorder Sister    Lung cancer Maternal Grandmother    Cancer Maternal Grandmother    Stroke Maternal Grandfather    Diabetes  Maternal Grandfather    Stroke Paternal Grandmother    Hyperlipidemia Paternal Grandmother    Diabetes Maternal Uncle    Cancer Paternal Grandfather    No Known Allergies  Patient Care Team: Erasmo Downer, MD as PCP - General (Family Medicine)   Medications: Outpatient Medications Prior to Visit  Medication Sig   [DISCONTINUED] hydrOXYzine (ATARAX) 10 MG tablet Take 1 tablet (10 mg total) by mouth 3 (three) times daily as needed.   [DISCONTINUED] meloxicam (MOBIC) 15 MG tablet Take 1 tablet (15 mg total) by mouth daily.   [DISCONTINUED] sertraline (ZOLOFT) 50 MG tablet Take 1 tablet (50 mg total) by mouth daily.   No facility-administered medications prior to visit.    Review of Systems  HENT:  Positive for ear pain.   All other systems reviewed and are negative.     Objective     BP 122/87 (BP Location: Left Arm, Patient Position: Sitting, Cuff Size: Large)   Pulse (!) 56   Temp 97.7 F (36.5 C) (Temporal)   Resp 12   Ht 5\' 9"  (1.753 m)   Wt 202 lb 4.8 oz (91.8 kg)   SpO2 99%   BMI 29.87 kg/m     Physical Exam Vitals reviewed.  Constitutional:      General: He is not in acute distress.    Appearance: Normal appearance. He is well-developed. He is not diaphoretic.  HENT:     Head: Normocephalic and atraumatic.     Right Ear: Tympanic membrane, ear canal and external ear normal.     Left Ear: Tympanic membrane, ear canal and external ear normal.     Nose: Nose normal.     Mouth/Throat:     Mouth: Mucous membranes are moist.     Pharynx: Oropharynx is clear. No oropharyngeal exudate.  Eyes:     General: No scleral icterus.    Conjunctiva/sclera: Conjunctivae normal.     Pupils: Pupils are equal, round, and reactive to light.  Neck:     Thyroid: No thyromegaly.  Cardiovascular:     Rate and Rhythm: Normal rate and regular rhythm.     Pulses: Normal pulses.     Heart sounds: Normal heart sounds. No murmur heard. Pulmonary:     Effort: Pulmonary effort is normal. No respiratory distress.     Breath sounds: Normal breath sounds. No wheezing or rales.  Abdominal:     General: There is no distension.     Palpations: Abdomen is soft.     Tenderness: There is no abdominal tenderness.  Musculoskeletal:        General: No deformity.     Cervical back: Neck supple.     Right lower leg: No edema.     Left lower leg: No edema.  Lymphadenopathy:     Cervical: No cervical adenopathy.  Skin:    General: Skin is warm and dry.     Findings: No rash.  Neurological:     Mental Status: He is alert and oriented to person, place, and time. Mental status is at baseline.     Gait: Gait normal.  Psychiatric:        Mood and Affect: Mood normal.        Behavior: Behavior normal.        Thought Content: Thought content normal.       Last depression  screening scores    08/17/2022    8:51 AM 12/23/2021    8:13 AM 08/04/2021    2:42  PM  PHQ 2/9 Scores  PHQ - 2 Score 1 4 0  PHQ- 9 Score  14 2   Last fall risk screening    08/17/2022    8:51 AM  Fall Risk   Falls in the past year? 0  Number falls in past yr: 0  Injury with Fall? 0  Risk for fall due to : No Fall Risks  Follow up Falls evaluation completed   Last Audit-C alcohol use screening    12/23/2021    8:14 AM  Alcohol Use Disorder Test (AUDIT)  1. How often do you have a drink containing alcohol? 3  2. How many drinks containing alcohol do you have on a typical day when you are drinking? 0  3. How often do you have six or more drinks on one occasion? 0  AUDIT-C Score 3   A score of 3 or more in women, and 4 or more in men indicates increased risk for alcohol abuse, EXCEPT if all of the points are from question 1   No results found for any visits on 08/17/22.  Assessment & Plan    Routine Health Maintenance and Physical Exam  Exercise Activities and Dietary recommendations  Goals   None     Immunization History  Administered Date(s) Administered   Hepatitis B 11/03/2013, 12/04/2013, 05/07/2014   Influenza-Unspecified 02/04/2017, 10/28/2017   MMR 04/02/2021, 04/30/2021   PFIZER(Purple Top)SARS-COV-2 Vaccination 05/05/2019, 05/26/2019   Td 03/10/2017   Tdap 10/29/2009    Health Maintenance  Topic Date Due   COVID-19 Vaccine (3 - 2023-24 season) 10/17/2021   INFLUENZA VACCINE  09/17/2022   DTaP/Tdap/Td (3 - Td or Tdap) 03/11/2027   Hepatitis C Screening  Completed   HIV Screening  Completed   HPV VACCINES  Aged Out    Discussed health benefits of physical activity, and encouraged him to engage in regular exercise appropriate for his age and condition.  Problem List Items Addressed This Visit       Other   Overweight   Generalized anxiety disorder   Other Visit Diagnoses     Encounter for annual physical exam    -  Primary   Relevant Orders    Comprehensive metabolic panel   Lipid Panel With LDL/HDL Ratio   VITAMIN D 25 Hydroxy (Vit-D Deficiency, Fractures)   Avitaminosis D       Relevant Orders   VITAMIN D 25 Hydroxy (Vit-D Deficiency, Fractures)   Dysfunction of left eustachian tube               Left Ear Pain: Chronic pain for 2-3 months, worse in the morning. No hearing abnormalities. Likely Eustachian tube dysfunction due to allergens. -Start Flonase (generic) two sprays each nostril at bedtime.  Anxiety: Improved with change in work situation. No current need for medication. -Continue monitoring. Consider starting medication if symptoms worsen with return to work.  General Health Maintenance: -Complete routine labs when fasting. -Flu shot in the fall. -COVID booster available. -Tetanus shot good until 2029. -Colon cancer screening to start at age 28. -Follow up in a year for physical unless needed sooner.        Return in about 1 year (around 08/17/2023) for CPE.     I, Shirlee Latch, MD, have reviewed all documentation for this visit. The documentation on 08/17/22 for the exam, diagnosis, procedures, and orders are all accurate and complete.   Bacigalupo, Marzella Schlein, MD, MPH [facility]

## 2022-08-17 ENCOUNTER — Ambulatory Visit (INDEPENDENT_AMBULATORY_CARE_PROVIDER_SITE_OTHER): Payer: BC Managed Care – PPO | Admitting: Family Medicine

## 2022-08-17 ENCOUNTER — Encounter: Payer: Self-pay | Admitting: Family Medicine

## 2022-08-17 VITALS — BP 122/87 | HR 56 | Temp 97.7°F | Resp 12 | Ht 69.0 in | Wt 202.3 lb

## 2022-08-17 DIAGNOSIS — Z0000 Encounter for general adult medical examination without abnormal findings: Secondary | ICD-10-CM | POA: Diagnosis not present

## 2022-08-17 DIAGNOSIS — E559 Vitamin D deficiency, unspecified: Secondary | ICD-10-CM

## 2022-08-17 DIAGNOSIS — F411 Generalized anxiety disorder: Secondary | ICD-10-CM

## 2022-08-17 DIAGNOSIS — H6992 Unspecified Eustachian tube disorder, left ear: Secondary | ICD-10-CM | POA: Diagnosis not present

## 2022-08-17 DIAGNOSIS — E663 Overweight: Secondary | ICD-10-CM | POA: Diagnosis not present

## 2022-08-17 MED ORDER — FLUTICASONE PROPIONATE 50 MCG/ACT NA SUSP: 2.0000 | Freq: Every day | NASAL | 6 refills | Status: DC

## 2023-08-17 ENCOUNTER — Encounter: Payer: Self-pay | Admitting: Family Medicine

## 2023-09-01 ENCOUNTER — Telehealth: Payer: Self-pay

## 2023-09-01 NOTE — Telephone Encounter (Signed)
 Orthotics have been thrown away/ recycled 06/19/2022-06/19/2022 Balance: $438.00 Attempted to contact 1x

## 2023-09-23 ENCOUNTER — Encounter: Payer: Self-pay | Admitting: Family Medicine

## 2023-09-23 ENCOUNTER — Ambulatory Visit: Payer: Self-pay | Admitting: Family Medicine

## 2023-09-23 VITALS — BP 114/82 | HR 82 | Temp 98.2°F | Ht 69.0 in | Wt 215.3 lb

## 2023-09-23 DIAGNOSIS — Z0001 Encounter for general adult medical examination with abnormal findings: Secondary | ICD-10-CM | POA: Diagnosis not present

## 2023-09-23 DIAGNOSIS — Z833 Family history of diabetes mellitus: Secondary | ICD-10-CM | POA: Diagnosis not present

## 2023-09-23 DIAGNOSIS — E559 Vitamin D deficiency, unspecified: Secondary | ICD-10-CM

## 2023-09-23 DIAGNOSIS — F411 Generalized anxiety disorder: Secondary | ICD-10-CM | POA: Diagnosis not present

## 2023-09-23 DIAGNOSIS — Z1211 Encounter for screening for malignant neoplasm of colon: Secondary | ICD-10-CM

## 2023-09-23 DIAGNOSIS — H9312 Tinnitus, left ear: Secondary | ICD-10-CM | POA: Diagnosis not present

## 2023-09-23 DIAGNOSIS — Z0000 Encounter for general adult medical examination without abnormal findings: Secondary | ICD-10-CM

## 2023-09-23 DIAGNOSIS — Z83438 Family history of other disorder of lipoprotein metabolism and other lipidemia: Secondary | ICD-10-CM

## 2023-09-23 DIAGNOSIS — Z23 Encounter for immunization: Secondary | ICD-10-CM

## 2023-09-23 MED ORDER — BUSPIRONE HCL 5 MG PO TABS: 5.0000 mg | ORAL_TABLET | Freq: Two times a day (BID) | ORAL | 1 refills | Status: AC

## 2023-09-23 NOTE — Progress Notes (Signed)
 Complete physical exam  Patient: Ian Harper   DOB: 06/28/1980   43 y.o. Male  MRN: [id number]  Subjective:    Chief Complaint  Patient presents with   Annual Exam    Diet: States he has been doing creatine and protein supplements last few months, normal diet otherwise Exercise: weightlifting 5-6x week, 1.5h Sleep: Some problems with falling asleep, has cut out caffeine to help & sees some improvement. has woken up a few times to use the bathroom during the night Overall feeling: well Concerns: Tinnitus in left ear onset x3 weeks. States he notices it when he is around a ceiling fan and when he has a beer Vaccines/ Screenings: Discuss colonoscopy, yes to pcv20    Ian Harper is a 44 y.o. male who presents today for a complete physical exam. He reports consuming a balanced, macronutrient conscious diet. Gym/ health club routine includes cardio and mod to heavy weightlifting. He generally feels well. He reports sleeping fairly well. He does have additional problems to discuss today.   He reports that he has been experiencing tinnitus and decreased hearing for the last three weeks. He notices it more in the morning and whenever he drinks a beer. The episodes of tinnitus last for the entire day before resolving. He denies any systemic or balance symptoms with these episodes of tinnitus. He reports no other concerns today.  Most recent fall risk assessment:    09/23/2023    9:56 AM  Fall Risk   Falls in the past year? 0  Number falls in past yr: 0  Injury with Fall? 0  Risk for fall due to : No Fall Risks  Follow up Falls evaluation completed     Most recent depression screenings:    09/23/2023    9:56 AM 08/17/2022    8:51 AM  PHQ 2/9 Scores  PHQ - 2 Score 1 1  PHQ- 9 Score 2       09/23/2023    9:56 AM  GAD 7 : Generalized Anxiety Score  Nervous, Anxious, on Edge 2  Control/stop worrying 2  Worry too much - different things 2  Trouble relaxing 2  Restless 0  Easily annoyed  or irritable 2  Afraid - awful might happen 2  Total GAD 7 Score 12          Patient Care Team: Myrla Jon HERO, MD as PCP - General (Family Medicine)   Outpatient Medications Prior to Visit  Medication Sig   [DISCONTINUED] fluticasone  (FLONASE ) 50 MCG/ACT nasal spray Place 2 sprays into both nostrils daily.   No facility-administered medications prior to visit.    ROS        Objective:     BP 114/82 (BP Location: Left Arm, Patient Position: Sitting)   Pulse 82   Temp 98.2 F (36.8 C) (Oral)   Ht 5' 9 (1.753 m)   Wt 215 lb 4.8 oz (97.7 kg)   SpO2 100%   BMI 31.79 kg/m    Physical Exam Vitals reviewed.  Constitutional:      General: He is not in acute distress.    Appearance: Normal appearance. He is not ill-appearing or diaphoretic.  HENT:     Right Ear: Tympanic membrane, ear canal and external ear normal.     Left Ear: Tympanic membrane, ear canal and external ear normal.     Nose: Nose normal.     Mouth/Throat:     Mouth: Mucous membranes are moist.  Pharynx: Oropharynx is clear. No oropharyngeal exudate or posterior oropharyngeal erythema.  Eyes:     General: No scleral icterus.    Conjunctiva/sclera: Conjunctivae normal.     Pupils: Pupils are equal, round, and reactive to light.  Cardiovascular:     Rate and Rhythm: Normal rate and regular rhythm.     Pulses: Normal pulses.     Heart sounds: Normal heart sounds. No murmur heard.    No friction rub. No gallop.  Pulmonary:     Effort: Pulmonary effort is normal. No respiratory distress.     Breath sounds: Normal breath sounds. No wheezing.  Abdominal:     General: Bowel sounds are normal. There is no distension.     Palpations: Abdomen is soft.     Tenderness: There is no abdominal tenderness. There is no guarding.  Musculoskeletal:     Cervical back: Normal range of motion.     Right lower leg: No edema.     Left lower leg: No edema.  Lymphadenopathy:     Cervical: No cervical  adenopathy.  Skin:    General: Skin is warm and dry.     Capillary Refill: Capillary refill takes less than 2 seconds.  Neurological:     Mental Status: He is alert and oriented to person, place, and time.  Psychiatric:        Mood and Affect: Mood normal.      No results found for any visits on 09/23/23.     Assessment & Plan:    Routine Health Maintenance and Physical Exam  Immunization History  Administered Date(s) Administered   Hepatitis B 11/03/2013, 12/04/2013, 05/07/2014   Influenza-Unspecified 02/04/2017, 10/28/2017   MMR 04/02/2021, 04/30/2021   PFIZER(Purple Top)SARS-COV-2 Vaccination 05/05/2019, 05/26/2019   PNEUMOCOCCAL CONJUGATE-20 09/23/2023   Td 03/10/2017   Tdap 10/29/2009    Health Maintenance  Topic Date Due   HPV VACCINES (1 - 3-dose SCDM series) Never done   Colonoscopy  Never done   COVID-19 Vaccine (3 - 2024-25 season) 10/09/2023 (Originally 10/18/2022)   INFLUENZA VACCINE  05/16/2024 (Originally 09/17/2023)   DTaP/Tdap/Td (3 - Td or Tdap) 03/11/2027   Pneumococcal Vaccine: 19-49 Years  Completed   Hepatitis B Vaccines  Completed   Hepatitis C Screening  Completed   HIV Screening  Completed   Meningococcal B Vaccine  Aged Out    Discussed health benefits of physical activity, and encouraged him to engage in regular exercise appropriate for his age and condition.  Problem List Items Addressed This Visit     Generalized anxiety disorder   Not currently managed. Has not been taking any medications, but has a history of taking buspar  for anxiety. Reports recent increase in anxiety with school year starting up again, especially in the evenings. Would like to try buspar  in the evening. - Start Buspar  5mg  daily in the evening, increasing to two times daily if needed for anxiety      Relevant Medications   busPIRone  (BUSPAR ) 5 MG tablet   Family history of hyperlipidemia   Currently managed without medication. History of multiple family members with  stroke and underlying high cholesterol. Last FLP and CMP were stable. - Continue lifestyle modifications - Order FLP and CMP       Relevant Orders   Lipid Profile   Comprehensive Metabolic Panel (CMET)   Other Visit Diagnoses       Encounter for annual physical exam    -  Primary   Relevant Orders   Lipid  Profile   Comprehensive Metabolic Panel (CMET)   Vitamin D  (25 hydroxy)   Ambulatory referral to Gastroenterology   HgB A1c     Avitaminosis D       Relevant Orders   Vitamin D  (25 hydroxy)     Tinnitus aurium, left         Family history of diabetes mellitus in first degree relative       Relevant Orders   HgB A1c     Immunization due       Relevant Orders   Pneumococcal conjugate vaccine 20-valent (Prevnar 20) (Completed)     Colon cancer screening       Relevant Orders   Ambulatory referral to Gastroenterology      Avitaminosis D Not managed currently. History of low Vit D levels. Denies taking any supplements, but has been spending more time outdoors. - Order Vit D - Start Vit D supplementation if levels return low  Tinnitus aurium, left Reports a 3 week history of intermittent tinnitus without systemic symptoms, vertigo, or hearing loss. States that symptoms are worst in the morning and last the entire day when present. Exacerbated by alcohol and caffeine. Remote history of vertigo more than a year ago. Denies any other symptoms. Differential includes: transient tinnitus vs dehydration/blood pressure changes vs Meniere disease - Continue to monitor symptoms, returning if they worsen or present with vertigo and/or hearing loss  Family history of diabetes mellitus in first degree relative Reports history of DM in multiple first degree relatives. Would like screening to help prevent and monitor progression. - Discussed lifestyle modifications - Order A1C  General Health Maintenance Discussed wellness and preventative health screenings to achieve health maintenance  goals. - Encouraged flu and Covid vaccinations in the fall - Schedule colonoscopy - Administer pneumonia vaccine - Up to date on other wellness measures and preventative screenings   Return in about 1 year (around 09/22/2024) for CPE.     Elia LULLA Blanch, Medical Student   Patient seen along with MS[employer] student, Elia Blanch. I personally evaluated this patient along with the student, and verified all aspects of the history, physical exam, and medical decision making as documented by the student. I agree with the student's documentation and have made all necessary edits.  Caylynn Minchew, Jon HERO, MD, MPH [facility]

## 2023-09-23 NOTE — Assessment & Plan Note (Addendum)
 Currently managed without medication. History of multiple family members with stroke and underlying high cholesterol. Last FLP and CMP were stable. - Continue lifestyle modifications - Order FLP and CMP

## 2023-09-23 NOTE — Assessment & Plan Note (Signed)
 Not currently managed. Has not been taking any medications, but has a history of taking buspar  for anxiety. Reports recent increase in anxiety with school year starting up again, especially in the evenings. Would like to try buspar  in the evening. - Start Buspar  5mg  daily in the evening, increasing to two times daily if needed for anxiety

## 2023-09-24 LAB — HEMOGLOBIN A1C
Est. average glucose Bld gHb Est-mCnc: 105 mg/dL
Hgb A1c MFr Bld: 5.3 % (ref 4.8–5.6)

## 2023-09-24 LAB — COMPREHENSIVE METABOLIC PANEL WITH GFR
ALT: 36 IU/L (ref 0–44)
AST: 29 IU/L (ref 0–40)
Albumin: 4.4 g/dL (ref 4.1–5.1)
Alkaline Phosphatase: 70 IU/L (ref 44–121)
BUN/Creatinine Ratio: 15 (ref 9–20)
BUN: 21 mg/dL (ref 6–24)
Bilirubin Total: 0.8 mg/dL (ref 0.0–1.2)
CO2: 22 mmol/L (ref 20–29)
Calcium: 9.7 mg/dL (ref 8.7–10.2)
Chloride: 101 mmol/L (ref 96–106)
Creatinine, Ser: 1.38 mg/dL — ABNORMAL HIGH (ref 0.76–1.27)
Globulin, Total: 2.4 g/dL (ref 1.5–4.5)
Glucose: 89 mg/dL (ref 70–99)
Potassium: 4.4 mmol/L (ref 3.5–5.2)
Sodium: 139 mmol/L (ref 134–144)
Total Protein: 6.8 g/dL (ref 6.0–8.5)
eGFR: 64 mL/min/1.73 (ref >59)

## 2023-09-24 LAB — LIPID PANEL
Chol/HDL Ratio: 4.8 ratio (ref 0.0–5.0)
Cholesterol, Total: 209 mg/dL — ABNORMAL HIGH (ref 100–199)
HDL: 44 mg/dL (ref >39)
LDL Chol Calc (NIH): 149 mg/dL — ABNORMAL HIGH (ref 0–99)
Triglycerides: 86 mg/dL (ref 0–149)
VLDL Cholesterol Cal: 16 mg/dL (ref 5–40)

## 2023-09-24 LAB — VITAMIN D 25 HYDROXY (VIT D DEFICIENCY, FRACTURES): Vit D, 25-Hydroxy: 41.7 ng/mL (ref 30.0–100.0)

## 2023-09-27 ENCOUNTER — Ambulatory Visit: Payer: Self-pay | Admitting: Family Medicine

## 2023-09-28 NOTE — Progress Notes (Signed)
 Seen by patient Ian Harper on 09/27/2023 12:15 PM
